# Patient Record
Sex: Female | Born: 1990 | Race: White | Hispanic: No | Marital: Single | Smoking: Never smoker
Health system: Southern US, Community
[De-identification: ages and names within clinical notes are randomized; demographics above are authoritative.]

## PROBLEM LIST (undated history)

## (undated) DIAGNOSIS — R569 Unspecified convulsions: Secondary | ICD-10-CM

## (undated) HISTORY — PX: KNEE SURGERY: SHX244

## (undated) HISTORY — PX: OTHER SURGICAL HISTORY: SHX169

---
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
## 2009-10-23 ENCOUNTER — Ambulatory Visit: Payer: Self-pay | Admitting: Internal Medicine

## 2011-08-12 ENCOUNTER — Observation Stay: Payer: Self-pay | Admitting: Obstetrics & Gynecology

## 2011-08-25 ENCOUNTER — Ambulatory Visit: Payer: Self-pay | Admitting: Obstetrics and Gynecology

## 2011-08-25 LAB — CBC WITH DIFFERENTIAL/PLATELET
Basophil #: 0 10*3/uL (ref 0.0–0.1)
Eosinophil #: 0.3 10*3/uL (ref 0.0–0.7)
Eosinophil %: 2.5 %
HCT: 42.2 % (ref 35.0–47.0)
Lymphocyte %: 20.5 %
MCH: 32.3 pg (ref 26.0–34.0)
MCHC: 33.7 g/dL (ref 32.0–36.0)
MCV: 96 fL (ref 80–100)
Monocyte %: 7.9 %
Neutrophil #: 7.9 10*3/uL — ABNORMAL HIGH (ref 1.4–6.5)
Neutrophil %: 68.8 %
Platelet: 266 10*3/uL (ref 150–440)
RDW: 12.9 % (ref 11.5–14.5)
WBC: 11.4 10*3/uL — ABNORMAL HIGH (ref 3.6–11.0)

## 2011-08-26 ENCOUNTER — Inpatient Hospital Stay: Payer: Self-pay | Admitting: Obstetrics and Gynecology

## 2011-08-26 LAB — BASIC METABOLIC PANEL
Anion Gap: 11 (ref 7–16)
Calcium, Total: 8.6 mg/dL (ref 8.5–10.1)
Co2: 24 mmol/L (ref 21–32)
Creatinine: 0.73 mg/dL (ref 0.60–1.30)
EGFR (African American): >60
EGFR (Non-African Amer.): >60
Glucose: 91 mg/dL (ref 65–99)
Potassium: 3.8 mmol/L (ref 3.5–5.1)
Sodium: 142 mmol/L (ref 136–145)

## 2014-03-18 ENCOUNTER — Emergency Department: Payer: Self-pay | Admitting: Student

## 2014-11-10 NOTE — Op Note (Signed)
PATIENT NAME:  Michelle Wallace, Michelle Wallace MR#:  [id number] DATE OF BIRTH:  09/12/1990  DATE OF PROCEDURE:  08/26/2011  PREOPERATIVE DIAGNOSIS: Term intrauterine pregnancy at 39 weeks 1 day, in breech presentation.   POSTOPERATIVE DIAGNOSIS: Term intrauterine pregnancy at 39 weeks 1 day, in breech presentation.   PROCEDURE PERFORMED: Primary low transverse C-section   SURGEON: Vena AustriaAndreas Lenice Koper, MD  ASSISTANT: Senaida LangeLashawn Weaver-Lee, MD  ANESTHESIA: Spinal epidural.  ESTIMATED BLOOD LOSS: 700 mL.  OPERATIVE FLUIDS: 1100 mL of crystalloid.   URINE OUTPUT: 400 mL of clear urine.   DRAINS OR TUBES: Foley to gravity drainage as well as On-Q system.  IMPLANTS: None.   INTRAOPERATIVE FINDINGS: Normal uterus, tubes, and ovaries. The fetus was noted to be in the footling breech position. Apgars 8 and 9. Weight 7 pounds, 11 ounces.   COMPLICATIONS: None.   PATIENT CONDITION FOLLOWING PROCEDURE: Stable.   PROCEDURE IN DETAIL: The risks, benefits, and alternatives of the procedure were discussed with the patient prior to proceeding to the Operating Room. The patient was placed under spinal anesthesia, prepped and draped in the usual sterile fashion, and positioned in the supine position. A Pfannenstiel skin incision was made 2 cm above the patient's pubic symphysis. This was carried down sharply to the level of the rectus fascia. The rectus fascia was incised sharply with the knife. The rectus fascia was then extended using Mayo scissors. The superior border of the rectus fascia was grasped with two Kocher clamps. The underlying rectus muscle was bluntly separated off the rectus fascia and the median raphe incised using Mayo scissors. The inferior border of the rectus fascia was then also dissected off the rectus muscles in a similar fashion. The rectus muscles were separated in the midline. The peritoneum was identified and entered bluntly. The peritoneal incision was extended using manual traction. A  bladder blade was placed. Next, a bladder flap was made and digitally developed using the operator's finger and the bladder blade was replaced. Hysterotomy incision was made. Upon entering the uterus bluntly, using the operator's finger, return of clear fluid was noted. The infant was noted to be in the footling breech position. The feet were grasped and delivered. The remainder of the body was delivered to the level of the axilla. The left arm was grasped, placed across the infant's chest, and then the infant was rotated 180 degrees. The right arm was delivered in a similar fashion. Mauriceau-Smellie-Viet maneuver was used to help delivery of the head with mild fundal pressure. The infant was suctioned, the cord was clamped and cut, and the infant was passed to the awaiting pediatricians. The placenta was delivered using manual extraction. The uterus was exteriorized and wiped clean of clot and debris. The hysterotomy incision was closed using two layers of 0 Vicryl, one running locked and one horizontal imbricating. Following closure of the hysterotomy, the abdomen was irrigated and wiped clean of clots and debris. The peritoneum was then closed using a running 2-0 Vicryl. The rectus fascia and the underlying rectus muscles were inspected and noted to be hemostatic. An On-Q system was placed at this time. The two trocars were placed approximately 2 to 3 cm above the superior border of the Pfannenstiel skin incision, in the midline. The trocars were then removed. The catheters were threaded through the introducers which were then removed. Following placement of the On-Q system, the rectus fascia was closed using a #1 loop PDS, in a running fashion. The skin was irrigated. Hemostasis was achieved using the  Bovie. A 2-0 chromic was used to close the subcutaneous dead space. Skin was closed using staples. The On-Q catheters were primed using 0.5% bupivacaine, 5 mL each, and the catheters were then dressed with  Steri-Strips and an OpSite. Sponge, needle, and instrument counts were correct x2. The patient tolerated the procedure well and was taken to the Recovery Room in stable condition. ____________________________ Florina Ou. Bonney Aid, MD ams:slb D: 09/06/2011 13:54:29 ET     T: 09/06/2011 15:40:56 ET       JOB#: [id number] Lorrene Reid MD ELECTRONICALLY SIGNED 09/07/2011 8:56

## 2014-11-26 NOTE — H&P (Signed)
L&D Evaluation:  History:   HPI 24 yo G1 at 9038w1d with fetus in breech presentation presenting for primary LTCS    Presents with SCheduled C-section    Patient's Medical History Obesity, seizures/convulsions    Patient's Surgical History none    Medications Pre Natal Vitamins    Allergies NKDA    Social History none    Family History Non-Contributory   ROS:   ROS All systems were reviewed.  HEENT, CNS, GI, GU, Respiratory, CV, Renal and Musculoskeletal systems were found to be normal.   Exam:   Vital Signs stable    Urine Protein not completed    General no apparent distress    Mental Status clear    Abdomen gravid, non-tender    Estimated Fetal Weight Average for gestational age    Fetal Position breech    Edema 1+  Cellulitis of the right lower extremity    Mebranes Intact    FHT normal rate with no decels    Ucx absent   Impression:   Impression 8038w1d wscheduled C-section for Breech   Plan:   Comments - Ancef 2g preop - On-Q pump - Proceed with primary LTCS   Electronic Signatures: Lorrene ReidStaebler, Cory Kitt M (MD)  (Signed 07-Feb-13 07:16)  Authored: L&D Evaluation   Last Updated: 07-Feb-13 07:16 by Lorrene ReidStaebler, Leonila Speranza M (MD)

## 2016-04-30 LAB — HM HIV SCREENING LAB: HM HIV Screening: NEGATIVE

## 2017-11-11 DIAGNOSIS — R569 Unspecified convulsions: Secondary | ICD-10-CM | POA: Insufficient documentation

## 2017-11-11 LAB — HM PAP SMEAR: HM Pap smear: NEGATIVE

## 2018-12-12 ENCOUNTER — Telehealth: Payer: Self-pay

## 2018-12-12 DIAGNOSIS — Z20822 Contact with and (suspected) exposure to covid-19: Secondary | ICD-10-CM

## 2018-12-12 NOTE — Telephone Encounter (Signed)
[facility] [place]  [facility] called requesting Pt.  For Covid testing.  Ot.  Voices understand for appt. Made.

## 2018-12-13 ENCOUNTER — Other Ambulatory Visit: Payer: Self-pay

## 2018-12-13 DIAGNOSIS — Z20822 Contact with and (suspected) exposure to covid-19: Secondary | ICD-10-CM

## 2018-12-14 LAB — NOVEL CORONAVIRUS, NAA: SARS-CoV-2, NAA: NOT DETECTED

## 2018-12-21 ENCOUNTER — Telehealth: Payer: Self-pay | Admitting: *Deleted

## 2018-12-21 NOTE — Telephone Encounter (Signed)
Pt calling for covid results; negative, verbalizes understanding. 

## 2019-02-26 ENCOUNTER — Ambulatory Visit
Admit: 2019-02-26 | Discharge: 2019-02-26 | Disposition: A | Discharge status: Home / Self Care | Payer: Medicaid Other | Attending: Family Medicine | Admitting: Family Medicine

## 2019-02-26 ENCOUNTER — Other Ambulatory Visit: Payer: Self-pay

## 2019-02-26 ENCOUNTER — Encounter: Payer: Self-pay | Admitting: Emergency Medicine

## 2019-02-26 ENCOUNTER — Ambulatory Visit
Admission: EM | Admit: 2019-02-26 | Discharge: 2019-02-26 | Disposition: A | Discharge status: Home / Self Care | Payer: Medicaid Other | Attending: Family Medicine | Admitting: Family Medicine

## 2019-02-26 DIAGNOSIS — R102 Pelvic and perineal pain: Secondary | ICD-10-CM

## 2019-02-26 DIAGNOSIS — N9489 Other specified conditions associated with female genital organs and menstrual cycle: Secondary | ICD-10-CM

## 2019-02-26 LAB — URINALYSIS, COMPLETE (UACMP) WITH MICROSCOPIC
Glucose, UA: NEGATIVE mg/dL
Nitrite: NEGATIVE
Specific Gravity, Urine: >1.03 — ABNORMAL HIGH (ref 1.005–1.030)
pH: 5.5 (ref 5.0–8.0)

## 2019-02-26 LAB — PREGNANCY, URINE: Preg Test, Ur: NEGATIVE

## 2019-02-26 NOTE — ED Provider Notes (Signed)
[facility]    CSN: [id number] Arrival date & time: 02/26/19  1005     History   Chief Complaint Chief Complaint  Patient presents with  . Pelvic Pain    right    HPI Michelle Wallace is a 28 y.o. female.   28 yo female with a c/o right sided pelvic pain for the past 1-2 weeks. Denies any dysuria, hematuria, fevers, chills, vomiting, vaginal discharge, injuries, diarrhea. States she has had some constipation. Also states she has a h/o a right  fallopian tube abscess about 1 year ago which required hospitalization and IV antibiotics. States she did not require surgery.    Pelvic Pain    History reviewed. No pertinent past medical history.  There are no active problems to display for this patient.   Past Surgical History:  Procedure Laterality Date  . CESAREAN SECTION    . KNEE SURGERY Left     OB History   No obstetric history on file.      Home Medications    Prior to Admission medications   Medication Sig Start Date End Date Taking? Authorizing Provider  levonorgestrel (MIRENA) 20 MCG/24HR IUD 1 each by Intrauterine route once.   Yes [provider]    Family History Family History  Problem Relation Age of Onset  . Healthy Mother   . Kidney Stones Father     Social History Social History   Tobacco Use  . Smoking status: Never Smoker  . Smokeless tobacco: Never Used  Substance Use Topics  . Alcohol use: Not Currently  . Drug use: Never     Allergies   Patient has no known allergies.   Review of Systems Review of Systems  Genitourinary: Positive for pelvic pain.     Physical Exam Triage Vital Signs ED Triage Vitals  Enc Vitals Group     BP 02/26/19 1037 116/74     Pulse Rate 02/26/19 1037 63     Resp 02/26/19 1037 14     Temp 02/26/19 1037 98.4 F (36.9 C)     Temp Source 02/26/19 1037 Oral     SpO2 02/26/19 1037 100 %     Weight 02/26/19 1033 280 lb (127 kg)     Height 02/26/19 1033 5\' 7"  (1.702 m)     Head Circumference --      Peak Flow --      Pain Score 02/26/19 1033 5     Pain Loc --      Pain Edu? --      Excl. in [place]? --    No data found.  Updated Vital Signs BP 116/74 (BP Location: Left Arm)   Pulse 63   Temp 98.4 F (36.9 C) (Oral)   Resp 14   Ht 5\' 7"  (1.702 m)   Wt 127 kg   LMP 02/19/2019 (Approximate)   SpO2 100%   BMI 43.85 kg/m   Visual Acuity Right Eye Distance:   Left Eye Distance:   Bilateral Distance:    Right Eye Near:   Left Eye Near:    Bilateral Near:     Physical Exam Vitals signs reviewed.  Constitutional:      General: She is not in acute distress.    Appearance: She is not toxic-appearing or diaphoretic.  Abdominal:     General: Bowel sounds are normal. There is no distension.     Palpations: Abdomen is soft. There is no mass.     Tenderness: There is  abdominal tenderness (right lower abdomen (pelvis area); not tender at McBurney's point). There is no right CVA tenderness, left CVA tenderness, guarding or rebound.     Hernia: No hernia is present.  Neurological:     Mental Status: She is alert.      UC Treatments / Results  Labs (all labs ordered are listed, but only abnormal results are displayed) Labs Reviewed  URINALYSIS, COMPLETE (UACMP) WITH MICROSCOPIC - Abnormal; Notable for the following components:      Result Value   APPearance HAZY (*)    Specific Gravity, Urine >1.030 (*)    Hgb urine dipstick LARGE (*)    Bilirubin Urine SMALL (*)    Ketones, ur TRACE (*)    Protein, ur TRACE (*)    Leukocytes,Ua TRACE (*)    Bacteria, UA FEW (*)    All other components within normal limits  URINE CULTURE  PREGNANCY, URINE    EKG   Radiology [place] Pelvic Complete With Transvaginal  Result Date: 02/26/2019 CLINICAL DATA:  Pelvic pain. EXAM: TRANSABDOMINAL AND TRANSVAGINAL ULTRASOUND OF PELVIS TECHNIQUE: Both transabdominal and transvaginal ultrasound examinations of the pelvis were performed. Transabdominal technique was  performed for global imaging of the pelvis including uterus, ovaries, adnexal regions, and pelvic cul-de-sac. It was necessary to proceed with endovaginal exam following the transabdominal exam to visualize the uterus and ovaries. COMPARISON:  01/14/2013. FINDINGS: Limited exam due to patient's body habitus. Uterus Measurements: 8.9 x 3.4 x 4.2 cm = volume: 65.2 mL. No fibroids or other mass visualized. Endometrium Thickness: 7.1 mm. IUD is noted position in the cervix area. Trace amount of fluid noted endometrial canal. Right ovary Measurements: Right ovary not visualized. Prominent serpiginous structure in the right adnexa most consistent with prominent fallopian tube. PID/tubo-ovarian abscess cannot be excluded. To exclude ectopic pregnancy, pregnancy test is suggested. Left ovary Measurements: Left ovary not visualized. Other findings Trace free pelvic fluid. IMPRESSION: 1. Limited exam due to patient's body habitus. Prominence of vision structure noted in the right adnexa most consistent prominent fallopian tube. PID/tubo-ovarian abscess cannot be excluded. To exclude ectopic pregnancy, pregnancy test is suggested. 2. IUD is noted in the cervix. Small amount of fluid noted in the endometrial cavity. 3.  Trace free pelvic fluid. Electronically Signed   By: Maisie Fushomas  Register   On: 02/26/2019 12:49    Procedures Procedures (including critical care time)  Medications Ordered in UC Medications - No data to display  Initial Impression / Assessment and Plan / UC Course  I have reviewed the triage vital signs and the nursing notes.  Pertinent labs & imaging results that were available during my care of the patient were reviewed by me and considered in my medical decision making (see chart for details).      Final Clinical Impressions(s) / UC Diagnoses   Final diagnoses:  Pelvic pain  Adnexal mass     Discharge Instructions     Recommend patient go to Emergency Department for further evaluation  and management    ED Prescriptions    None     1. Labs/US results and diagnosis reviewed with patient; recommend patient go to Emergency Department for further evaluation and management. Patient in stable condition. Will proceed by private vehicle.   Controlled Substance Prescriptions [place] Controlled Substance Registry consulted? Not Applicable   Payton Mccallumonty, Elizah Mierzwa, MD 02/26/19 [phone]

## 2019-02-26 NOTE — ED Triage Notes (Signed)
Patient c/o right sided pelvic pain that started about 1-2 weeks ago.  Patient reports having some constipation.  Patient also states that she was diagnosed with an abscess on her right fallopian tube last year.  Patient states that she has had her IUD in for 5 years. Patient denies vaginal discharge.  Patient denies any urinary symptoms.

## 2019-02-26 NOTE — Discharge Instructions (Signed)
Recommend patient go to Emergency Department for further evaluation and management °

## 2019-02-27 LAB — URINE CULTURE
Culture: <10000 — AB
Special Requests: NORMAL

## 2019-11-20 DIAGNOSIS — R569 Unspecified convulsions: Secondary | ICD-10-CM

## 2019-11-21 ENCOUNTER — Encounter: Payer: Self-pay | Admitting: Physician Assistant

## 2019-11-21 ENCOUNTER — Ambulatory Visit: Payer: Self-pay | Admitting: Physician Assistant

## 2019-11-21 ENCOUNTER — Other Ambulatory Visit: Payer: Self-pay

## 2019-11-21 DIAGNOSIS — Z113 Encounter for screening for infections with a predominantly sexual mode of transmission: Secondary | ICD-10-CM

## 2019-11-21 DIAGNOSIS — Z202 Contact with and (suspected) exposure to infections with a predominantly sexual mode of transmission: Secondary | ICD-10-CM

## 2019-11-21 LAB — WET PREP FOR TRICH, YEAST, CLUE
Trichomonas Exam: NEGATIVE
Yeast Exam: NEGATIVE

## 2019-11-21 MED ORDER — CEFTRIAXONE SODIUM 250 MG IJ SOLR
500.0000 mg | Freq: Once | INTRAMUSCULAR | Status: AC
  Administered 2019-11-21: 500 mg via INTRAMUSCULAR

## 2019-11-21 MED ORDER — AZITHROMYCIN 500 MG PO TABS
1000.0000 mg | ORAL_TABLET | Freq: Once | ORAL | Status: AC
  Administered 2019-11-21: 1000 mg via ORAL

## 2019-11-21 NOTE — Progress Notes (Signed)
Wet mount reviewed with provider and no treatment needed per Michelle Haber, PA verbal order as pt is not having any symptoms. Pt treated as a contact to Gonorrhea per Michelle Haber, PA orders and pt tolerated well. Provider orders completed.Lyman Speller, RN

## 2019-11-21 NOTE — Progress Notes (Signed)
Pt here for STD screening.Kasia Kyren Knick, RN 

## 2019-11-22 ENCOUNTER — Encounter: Payer: Self-pay | Admitting: Physician Assistant

## 2019-11-22 NOTE — Progress Notes (Signed)
[facility] Department STI clinic/screening visit  Subjective:  LAMONT GLASSCOCK is a 29 y.o. female being seen today for an STI screening visit. The patient reports they do not have symptoms.  Patient reports that they do not desire a pregnancy in the next year.   They reported they are not interested in discussing contraception today.  No LMP recorded (lmp unknown).   Patient has the following medical conditions:   Patient Active Problem List   Diagnosis Date Noted  . Seizures (HCC) 11/11/2017    Chief Complaint  Patient presents with  . SEXUALLY TRANSMITTED DISEASE    STD screening    HPI  Patient reports that she is not having any symptoms but is a contact to [facility] and would like a screening and treatment today.  Reports history of seizures but is not on any medication and it has been years since her last seizure.  Reports that in 2020 she had R salpingectomy due to tubo-ovarian abscess on that side twice.  Also, reports that she has both ovaries and her left tube still.  States that she had a Mirena but it was removed due to the fact that it had shifted and was no longer in the proper place.    See flowsheet for further details and programmatic requirements.    The following portions of the patient's history were reviewed and updated as appropriate: allergies, current medications, past medical history, past social history, past surgical history and problem list.  Objective:  There were no vitals filed for this visit.  Physical Exam Constitutional:      General: She is not in acute distress.    Appearance: Normal appearance. She is obese.  HENT:     Head: Normocephalic and atraumatic.     Comments: No nits, lice, or hair loss. No cervical, supraclavicular, or axillary adenopathy.     Mouth/Throat:     Mouth: Mucous membranes are moist.     Pharynx: Oropharynx is clear. No oropharyngeal exudate or posterior oropharyngeal erythema.  Eyes:   Conjunctiva/sclera: Conjunctivae normal.  Pulmonary:     Effort: Pulmonary effort is normal.  Abdominal:     Palpations: Abdomen is soft. There is no mass.     Tenderness: There is no abdominal tenderness. There is no guarding or rebound.  Genitourinary:    General: Normal vulva.     Rectum: Normal.     Comments: External genitalia/pubic area without nits, lice, edema,erythema, lesions and inguinal adenopathy. Vagina with normal mucosa and discharge. Cervix without visible lesions. Uterus firm, mobile, nt, no masses, no CMT, no adnexal tenderness or fullness. Musculoskeletal:     Cervical back: Neck supple. No tenderness.  Skin:    General: Skin is warm and dry.     Findings: No bruising, erythema, lesion or rash.  Neurological:     Mental Status: She is alert and oriented to person, place, and time.  Psychiatric:        Mood and Affect: Mood normal.        Behavior: Behavior normal.        Thought Content: Thought content normal.        Judgment: Judgment normal.      Assessment and Plan:  MARIELIS SAMARA is a 29 y.o. female presenting to the [facility] Department for STI screening  1. Screening for STD (sexually transmitted disease) Patient into clinic without symptoms. Rec condoms with all sex. Await test results.  Counseled that RN will call  if needs to RTC for further treatment once results are back.  - WET PREP FOR [place], YEAST, CLUE - Chlamydia/Gonorrhea [place] [facility] - HIV [place] [facility] - Syphilis Serology, [place] [facility]  2. Gonorrhea contact Treat as a contact to [facility] and to cover for possible Chlamydia infection with Ceftriaxone 500mg  IM and Azithromycin 1 g po DOT today. No sex for 7 days and until after partner completes treatment. - cefTRIAXone (ROCEPHIN) injection 500 mg - azithromycin (ZITHROMAX) tablet 1,000 mg     Return if symptoms worsen or fail to improve.  No future appointments.  Jerene Dilling, PA

## 2019-11-28 ENCOUNTER — Telehealth: Payer: Self-pay | Admitting: Family Medicine

## 2019-11-28 ENCOUNTER — Telehealth: Payer: Self-pay

## 2019-11-28 DIAGNOSIS — A549 Gonococcal infection, unspecified: Secondary | ICD-10-CM

## 2019-11-28 NOTE — Telephone Encounter (Signed)
Patient would like STI results. °

## 2019-11-28 NOTE — Telephone Encounter (Signed)
TC to patient. Verified ID via password/SS#. Informed of positive GC. Patient tx'd at visit on 11/18/19 and partner has been treated also. Instructed to f/u in 2-3 months for TOC.Richmond Campbell, RN

## 2019-11-28 NOTE — Telephone Encounter (Signed)
Phone call to pt. Pt counseled that we do not have all her test results from last weeks testing (pt specifically concerned about GC). Pt counseled that if something comes back positive, we would call her right away. TR appt scheduled for 12/12/19 per pt request.

## 2019-12-12 ENCOUNTER — Other Ambulatory Visit: Payer: Self-pay

## 2020-03-08 ENCOUNTER — Ambulatory Visit
Admission: EM | Admit: 2020-03-08 | Discharge: 2020-03-08 | Disposition: A | Discharge status: Home / Self Care | Payer: Medicaid Other | Attending: Family Medicine | Admitting: Family Medicine

## 2020-03-08 ENCOUNTER — Encounter: Payer: Self-pay | Admitting: Emergency Medicine

## 2020-03-08 ENCOUNTER — Other Ambulatory Visit: Payer: Self-pay

## 2020-03-08 DIAGNOSIS — L02412 Cutaneous abscess of left axilla: Secondary | ICD-10-CM

## 2020-03-08 MED ORDER — DOXYCYCLINE HYCLATE 100 MG PO CAPS: 100.0000 mg | ORAL_CAPSULE | Freq: Two times a day (BID) | ORAL | 0 refills | Status: DC

## 2020-03-08 MED ORDER — FLUCONAZOLE 150 MG PO TABS: 150.0000 mg | ORAL_TABLET | Freq: Once | ORAL | 0 refills | Status: AC

## 2020-03-08 NOTE — ED Triage Notes (Signed)
Patient c/o abscess in her left armpit.  Patient reports redness and tenderness at the site.

## 2020-03-08 NOTE — ED Provider Notes (Signed)
[facility]    CSN: [id number] Arrival date & time: 03/08/20  1244      History   Chief Complaint Chief Complaint  Patient presents with  . Abscess   HPI  29 year old female presents with a painful bump in her left axilla.  Patient states that she has noticed this over the past few days.  She states that the area is red and is tender to palpation.  She is unsure of the inciting event but has recently shaved.  Patient states that she only has pain when the area is palpated.  No fever.  No drainage.  No medications or interventions tried.  No relieving factors.  No other reported symptoms.  No other complaints.  Patient Active Problem List   Diagnosis Date Noted  . Seizures (HCC) 11/11/2017   Past Surgical History:  Procedure Laterality Date  . CESAREAN SECTION    . KNEE SURGERY Left   . right salpingectomy      OB History   No obstetric history on file.      Home Medications    Prior to Admission medications   Medication Sig Start Date End Date Taking? Authorizing Provider  doxycycline (VIBRAMYCIN) 100 MG capsule Take 1 capsule (100 mg total) by mouth 2 (two) times daily. 03/08/20   Tommie Sams, DO  fluconazole (DIFLUCAN) 150 MG tablet Take 1 tablet (150 mg total) by mouth once for 1 dose. Repeat dose in 72 hours. 03/08/20 03/08/20  Tommie Sams, DO    Family History Family History  Problem Relation Age of Onset  . Healthy Mother   . Kidney Stones Father     Social History Social History   Tobacco Use  . Smoking status: Never Smoker  . Smokeless tobacco: Never Used  Vaping Use  . Vaping Use: Never used  Substance Use Topics  . Alcohol use: Not Currently  . Drug use: Never     Allergies   Patient has no known allergies.   Review of Systems Review of Systems  Constitutional: Negative.   Skin:       Bump, left axilla.   Physical Exam Triage Vital Signs ED Triage Vitals  Enc Vitals Group     BP 03/08/20 1328 123/76     Pulse  Rate 03/08/20 1328 78     Resp 03/08/20 1328 16     Temp 03/08/20 1328 98.5 F (36.9 C)     Temp Source 03/08/20 1328 Oral     SpO2 03/08/20 1328 98 %     Weight 03/08/20 1323 280 lb (127 kg)     Height 03/08/20 1323 5\' 7"  (1.702 m)     Head Circumference --      Peak Flow --      Pain Score 03/08/20 1323 0     Pain Loc --      Pain Edu? --      Excl. in [place]? --    Updated Vital Signs BP 123/76 (BP Location: Left Arm)   Pulse 78   Temp 98.5 F (36.9 C) (Oral)   Resp 16   Ht 5\' 7"  (1.702 m)   Wt 127 kg   LMP 02/23/2020 (Approximate)   SpO2 98%   BMI 43.85 kg/m   Visual Acuity Right Eye Distance:   Left Eye Distance:   Bilateral Distance:    Right Eye Near:   Left Eye Near:    Bilateral Near:     Physical Exam Vitals and nursing  note reviewed.  Constitutional:      General: She is not in acute distress.    Appearance: Normal appearance. She is obese. She is not ill-appearing.  HENT:     Head: Normocephalic and atraumatic.  Eyes:     General:        Right eye: No discharge.        Left eye: No discharge.     Conjunctiva/sclera: Conjunctivae normal.  Pulmonary:     Effort: Pulmonary effort is normal. No respiratory distress.  Skin:    Comments: Left axilla with an area of fluctuance and induration.  The erythema spans 9 cm x 3 cm.  Neurological:     Mental Status: She is alert.  Psychiatric:        Mood and Affect: Mood normal.        Behavior: Behavior normal.     UC Treatments / Results  Labs (all labs ordered are listed, but only abnormal results are displayed) Labs Reviewed - No data to display  EKG   Radiology No results found.  Procedures Incision and Drainage  Date/Time: 03/08/2020 4:22 PM Performed by: Tommie Sams, DO Authorized by: Tommie Sams, DO   Consent:    Consent obtained:  Verbal   Consent given by:  Patient Location:    Type:  Abscess   Location:  Trunk   Trunk location: Left axilla. Pre-procedure details:    Skin  preparation:  Chloraprep Anesthesia (see MAR for exact dosages):    Anesthesia method:  Local infiltration   Local anesthetic:  Lidocaine 1% WITH epi Procedure type:    Complexity:  Simple Procedure details:    Incision types:  Stab incision   Scalpel blade:  11   Drainage:  Purulent and bloody   Drainage amount:  Moderate   Wound treatment:  Wound left open   Packing materials:  1/4 in gauze Post-procedure details:    Patient tolerance of procedure:  Tolerated well, no immediate complications   (including critical care time)  Medications Ordered in UC Medications - No data to display  Initial Impression / Assessment and Plan / UC Course  I have reviewed the triage vital signs and the nursing notes.  Pertinent labs & imaging results that were available during my care of the patient were reviewed by me and considered in my medical decision making (see chart for details).    29 year old female presents with an abscess to the left axilla.  Incision and drainage performed as above.  Placing on doxycycline.  Patient requested Diflucan for possible development of yeast vaginitis from antibiotic therapy.  Patient to remove packing in 48 hours.  Supportive care.  Final Clinical Impressions(s) / UC Diagnoses   Final diagnoses:  Abscess of left axilla     Discharge Instructions     Keep clean.  Remove packing in 48 hours.  Antibiotic as prescribed.  Take care  Dr. Adriana Simas    ED Prescriptions    Medication Sig Dispense Auth. Provider   doxycycline (VIBRAMYCIN) 100 MG capsule Take 1 capsule (100 mg total) by mouth 2 (two) times daily. 20 capsule Trease Bremner G, DO   fluconazole (DIFLUCAN) 150 MG tablet Take 1 tablet (150 mg total) by mouth once for 1 dose. Repeat dose in 72 hours. 2 tablet Tommie Sams, DO     PDMP not reviewed this encounter.   Tommie Sams, [place] 03/08/20 1623

## 2020-03-08 NOTE — Discharge Instructions (Signed)
Keep clean.  Remove packing in 48 hours.  Antibiotic as prescribed.  Take care  Dr. Adriana Simas

## 2020-08-13 ENCOUNTER — Other Ambulatory Visit: Payer: Self-pay

## 2020-08-13 ENCOUNTER — Ambulatory Visit
Admission: EM | Admit: 2020-08-13 | Discharge: 2020-08-13 | Disposition: A | Discharge status: Home / Self Care | Payer: No Typology Code available for payment source | Attending: Physician Assistant | Admitting: Physician Assistant

## 2020-08-13 ENCOUNTER — Encounter: Payer: Self-pay | Admitting: Emergency Medicine

## 2020-08-13 DIAGNOSIS — Z8616 Personal history of COVID-19: Secondary | ICD-10-CM | POA: Diagnosis not present

## 2020-08-13 DIAGNOSIS — J029 Acute pharyngitis, unspecified: Secondary | ICD-10-CM | POA: Diagnosis not present

## 2020-08-13 DIAGNOSIS — Z20822 Contact with and (suspected) exposure to covid-19: Secondary | ICD-10-CM | POA: Diagnosis not present

## 2020-08-13 DIAGNOSIS — R059 Cough, unspecified: Secondary | ICD-10-CM | POA: Diagnosis not present

## 2020-08-13 DIAGNOSIS — J069 Acute upper respiratory infection, unspecified: Secondary | ICD-10-CM | POA: Diagnosis not present

## 2020-08-13 LAB — SARS CORONAVIRUS 2 (TAT 6-24 HRS): SARS Coronavirus 2: NEGATIVE

## 2020-08-13 LAB — GROUP A STREP BY PCR: Group A Strep by PCR: NOT DETECTED

## 2020-08-13 NOTE — ED Provider Notes (Signed)
[facility]    CSN: [id number] Arrival date & time: 08/13/20  0815      History   Chief Complaint Chief Complaint  Patient presents with  . Sore Throat  . Cough    HPI Michelle Wallace is a 29 y.o. female presenting for 3 day history of sore throat, nasal congestion, and cough.  Patient states that the sore throat seems to gotten little better but her cough is gotten worse.  She denies any fever, fatigue, body aches, headaches, chest discomfort, shortness of breath, abdominal pain, nausea/vomiting or diarrhea.  Patient denies any sick contacts.  She states that she thinks she might have been exposed to COVID-19 through a coworker but is not sure.  Admits to personal history of COVID-19 last year.  Has not been vaccinated for COVID-19.  Patient has been taking Mucinex and using cough drops with some improvement in symptoms.  She has no other concerns today.  HPI  History reviewed. No pertinent past medical history.  Patient Active Problem List   Diagnosis Date Noted  . Seizures (HCC) 11/11/2017    Past Surgical History:  Procedure Laterality Date  . CESAREAN SECTION    . KNEE SURGERY Left   . right salpingectomy      OB History   No obstetric history on file.      Home Medications    Prior to Admission medications   Medication Sig Start Date End Date Taking? Authorizing Provider  doxycycline (VIBRAMYCIN) 100 MG capsule Take 1 capsule (100 mg total) by mouth 2 (two) times daily. 03/08/20   Tommie Sams, DO    Family History Family History  Problem Relation Age of Onset  . Healthy Mother   . Kidney Stones Father     Social History Social History   Tobacco Use  . Smoking status: Never Smoker  . Smokeless tobacco: Never Used  Vaping Use  . Vaping Use: Never used  Substance Use Topics  . Alcohol use: Not Currently  . Drug use: Never     Allergies   Patient has no known allergies.   Review of Systems Review of Systems   Constitutional: Negative for chills, diaphoresis, fatigue and fever.  HENT: Positive for congestion and sore throat. Negative for ear pain, rhinorrhea, sinus pressure and sinus pain.   Respiratory: Positive for cough. Negative for shortness of breath.   Gastrointestinal: Negative for abdominal pain, nausea and vomiting.  Musculoskeletal: Negative for arthralgias and myalgias.  Skin: Negative for rash.  Neurological: Negative for weakness and headaches.  Hematological: Negative for adenopathy.     Physical Exam Triage Vital Signs ED Triage Vitals [08/13/20 0909]  Enc Vitals Group     BP      Pulse      Resp      Temp      Temp src      SpO2      Weight      Height 5\' 8"  (1.727 m)     Head Circumference      Peak Flow      Pain Score 4     Pain Loc      Pain Edu?      Excl. in GC?    No data found.  Updated Vital Signs BP 129/66 (BP Location: Right Arm)   Pulse 66   Temp 98.6 F (37 C) (Oral)   Resp 18   Ht 5\' 8"  (1.727 m)   LMP 08/06/2020   SpO2  100%   BMI 42.57 kg/m       Physical Exam Vitals and nursing note reviewed.  Constitutional:      General: She is not in acute distress.    Appearance: Normal appearance. She is not ill-appearing or toxic-appearing.  HENT:     Head: Normocephalic and atraumatic.     Nose: Congestion and rhinorrhea present.     Mouth/Throat:     Mouth: Mucous membranes are moist.     Pharynx: Oropharynx is clear. Posterior oropharyngeal erythema present.     Tonsils: 2+ on the right. 2+ on the left.  Eyes:     General: No scleral icterus.       Right eye: No discharge.        Left eye: No discharge.     Conjunctiva/sclera: Conjunctivae normal.  Cardiovascular:     Rate and Rhythm: Normal rate and regular rhythm.     Heart sounds: Normal heart sounds.  Pulmonary:     Effort: Pulmonary effort is normal. No respiratory distress.     Breath sounds: Normal breath sounds.  Musculoskeletal:     Cervical back: Neck supple.   Skin:    General: Skin is dry.  Neurological:     General: No focal deficit present.     Mental Status: She is alert. Mental status is at baseline.     Motor: No weakness.     Gait: Gait normal.  Psychiatric:        Mood and Affect: Mood normal.        Behavior: Behavior normal.        Thought Content: Thought content normal.      UC Treatments / Results  Labs (all labs ordered are listed, but only abnormal results are displayed) Labs Reviewed  SARS CORONAVIRUS 2 (TAT 6-24 HRS)  GROUP A STREP BY PCR    EKG   Radiology No results found.  Procedures Procedures (including critical care time)  Medications Ordered in UC Medications - No data to display  Initial Impression / Assessment and Plan / UC Course  I have reviewed the triage vital signs and the nursing notes.  Pertinent labs & imaging results that were available during my care of the patient were reviewed by me and considered in my medical decision making (see chart for details).   30 year old female with 3-day history of sore throat, cough and congestion.  Possible COVID-19 exposure.  She is not vaccinated.  All vital signs are normal and stable in the clinic.  Exam significant for 2+ tonsillar enlargement with erythema and nasal congestion/rhinorrhea.  Chest clear to auscultation heart regular rate and rhythm.  Negative rapid strep test. Suspect viral illness, Send out COVID-19 testing obtained.  Current CDC guidelines, isolation protocol and ED precautions reviewed if positive.  Advise supportive care with increasing rest and fluids.  Vies to continue Mucinex and throat lozenges.  Also advised she continue Chloraseptic spray and nasal saline.   Final Clinical Impressions(s) / UC Diagnoses   Final diagnoses:  Upper respiratory tract infection, unspecified type  Sore throat  Cough     Discharge Instructions     I will call you before noon today to let you know the results of the strep test if we are able  to run the rapid 1.  If we cannot then we will send the strep test out for culture which will take about 2 days to get the result back.  If test is positive you will need  antibiotics and I can send that when I get your results.  It is possible this could be COVID-19 or just a common cold.  Treatment is supportive at this time with continuing the measures you have been.  You have received COVID testing today either for positive exposure, concerning symptoms that could be related to COVID infection, screening purposes, or re-testing after confirmed positive.  Your test obtained today checks for active viral infection in the last 1-2 weeks. If your test is negative now, you can still test positive later. So, if you do develop symptoms you should either get re-tested and/or isolate x 5 days and then strict mask use x 5 days (unvaccinated) or mask use x 10 days (vaccinated). Please follow CDC guidelines.  While Rapid antigen tests come back in 15-20 minutes, send out PCR/molecular test results typically come back within 1-3 days. In the mean time, if you are symptomatic, assume this could be a positive test and treat/monitor yourself as if you do have COVID.   We will call with test results if positive. Please download the MyChart app and set up a profile to access test results.   If symptomatic, go home and rest. Push fluids. Take Tylenol as needed for discomfort. Gargle warm salt water. Throat lozenges. Take Mucinex DM or Robitussin for cough. Humidifier in bedroom to ease coughing. Warm showers. Also review the COVID handout for more information.  COVID-19 INFECTION: The incubation period of COVID-19 is approximately 14 days after exposure, with most symptoms developing in roughly 4-5 days. Symptoms may range in severity from mild to critically severe. Roughly 80% of those infected will have mild symptoms. People of any age may become infected with COVID-19 and have the ability to transmit the virus. The  most common symptoms include: fever, fatigue, cough, body aches, headaches, sore throat, nasal congestion, shortness of breath, nausea, vomiting, diarrhea, changes in smell and/or taste.    COURSE OF ILLNESS Some patients may begin with mild disease which can progress quickly into critical symptoms. If your symptoms are worsening please call ahead to the Emergency Department and proceed there for further treatment. Recovery time appears to be roughly 1-2 weeks for mild symptoms and 3-6 weeks for severe disease.   GO IMMEDIATELY TO ER FOR FEVER YOU ARE UNABLE TO GET DOWN WITH TYLENOL, BREATHING PROBLEMS, CHEST PAIN, FATIGUE, LETHARGY, INABILITY TO EAT OR DRINK, ETC  QUARANTINE AND ISOLATION: To help decrease the spread of COVID-19 please remain isolated if you have COVID infection or are highly suspected to have COVID infection. This means -stay home and isolate to one room in the home if you live with others. Do not share a bed or bathroom with others while ill, sanitize and wipe down all countertops and keep common areas clean and disinfected. Stay home for 5 days. If you have no symptoms or your symptoms are resolving after 5 days, you can leave your house. Continue to wear a mask around others for 5 additional days. If you have been in close contact (within 6 feet) of someone diagnosed with COVID 19, you are advised to quarantine in your home for 14 days as symptoms can develop anywhere from 2-14 days after exposure to the virus. If you develop symptoms, you  must isolate.  Most current guidelines for COVID after exposure -unvaccinated: isolate 5 days and strict mask use x 5 days. Test on day 5 is possible -vaccinated: wear mask x 10 days if symptoms do not develop -You do not necessarily  need to be tested for COVID if you have + exposure and  develop symptoms. Just isolate at home x10 days from symptom onset During this global pandemic, CDC advises to practice social distancing, try to stay at least  46ft away from others at all times. Wear a face covering. Wash and sanitize your hands regularly and avoid going anywhere that is not necessary.  KEEP IN MIND THAT THE COVID TEST IS NOT 100% ACCURATE AND YOU SHOULD STILL DO EVERYTHING TO PREVENT POTENTIAL SPREAD OF VIRUS TO OTHERS (WEAR MASK, WEAR GLOVES, WASH HANDS AND SANITIZE REGULARLY). IF INITIAL TEST IS NEGATIVE, THIS MAY NOT MEAN YOU ARE DEFINITELY NEGATIVE. MOST ACCURATE TESTING IS DONE 5-7 DAYS AFTER EXPOSURE.   It is not advised by CDC to get re-tested after receiving a positive COVID test since you can still test positive for weeks to months after you have already cleared the virus.   *If you have not been vaccinated for COVID, I strongly suggest you consider getting vaccinated as long as there are no contraindications.      ED Prescriptions    None     PDMP not reviewed this encounter.   Shirlee Latch, PA-C 08/13/20 1052

## 2020-08-13 NOTE — ED Triage Notes (Signed)
Patient c/o sore throat that started 3 days ago. Patient denies fever. She does report mild nasal congestion and cough.

## 2020-08-13 NOTE — Discharge Instructions (Signed)
I will call you before noon today to let you know the results of the strep test if we are able to run the rapid 1.  If we cannot then we will send the strep test out for culture which will take about 2 days to get the result back.  If test is positive you will need antibiotics and I can send that when I get your results.  It is possible this could be COVID-19 or just a common cold.  Treatment is supportive at this time with continuing the measures you have been.  You have received COVID testing today either for positive exposure, concerning symptoms that could be related to COVID infection, screening purposes, or re-testing after confirmed positive.  Your test obtained today checks for active viral infection in the last 1-2 weeks. If your test is negative now, you can still test positive later. So, if you do develop symptoms you should either get re-tested and/or isolate x 5 days and then strict mask use x 5 days (unvaccinated) or mask use x 10 days (vaccinated). Please follow CDC guidelines.  While Rapid antigen tests come back in 15-20 minutes, send out PCR/molecular test results typically come back within 1-3 days. In the mean time, if you are symptomatic, assume this could be a positive test and treat/monitor yourself as if you do have COVID.   We will call with test results if positive. Please download the MyChart app and set up a profile to access test results.   If symptomatic, go home and rest. Push fluids. Take Tylenol as needed for discomfort. Gargle warm salt water. Throat lozenges. Take Mucinex DM or Robitussin for cough. Humidifier in bedroom to ease coughing. Warm showers. Also review the COVID handout for more information.  COVID-19 INFECTION: The incubation period of COVID-19 is approximately 14 days after exposure, with most symptoms developing in roughly 4-5 days. Symptoms may range in severity from mild to critically severe. Roughly 80% of those infected will have mild symptoms. People  of any age may become infected with COVID-19 and have the ability to transmit the virus. The most common symptoms include: fever, fatigue, cough, body aches, headaches, sore throat, nasal congestion, shortness of breath, nausea, vomiting, diarrhea, changes in smell and/or taste.    COURSE OF ILLNESS Some patients may begin with mild disease which can progress quickly into critical symptoms. If your symptoms are worsening please call ahead to the Emergency Department and proceed there for further treatment. Recovery time appears to be roughly 1-2 weeks for mild symptoms and 3-6 weeks for severe disease.   GO IMMEDIATELY TO ER FOR FEVER YOU ARE UNABLE TO GET DOWN WITH TYLENOL, BREATHING PROBLEMS, CHEST PAIN, FATIGUE, LETHARGY, INABILITY TO EAT OR DRINK, ETC  QUARANTINE AND ISOLATION: To help decrease the spread of COVID-19 please remain isolated if you have COVID infection or are highly suspected to have COVID infection. This means -stay home and isolate to one room in the home if you live with others. Do not share a bed or bathroom with others while ill, sanitize and wipe down all countertops and keep common areas clean and disinfected. Stay home for 5 days. If you have no symptoms or your symptoms are resolving after 5 days, you can leave your house. Continue to wear a mask around others for 5 additional days. If you have been in close contact (within 6 feet) of someone diagnosed with COVID 19, you are advised to quarantine in your home for 14 days as symptoms  can develop anywhere from 2-14 days after exposure to the virus. If you develop symptoms, you  must isolate.  Most current guidelines for COVID after exposure -unvaccinated: isolate 5 days and strict mask use x 5 days. Test on day 5 is possible -vaccinated: wear mask x 10 days if symptoms do not develop -You do not necessarily need to be tested for COVID if you have + exposure and  develop symptoms. Just isolate at home x10 days from symptom  onset During this global pandemic, CDC advises to practice social distancing, try to stay at least 63ft away from others at all times. Wear a face covering. Wash and sanitize your hands regularly and avoid going anywhere that is not necessary.  KEEP IN MIND THAT THE COVID TEST IS NOT 100% ACCURATE AND YOU SHOULD STILL DO EVERYTHING TO PREVENT POTENTIAL SPREAD OF VIRUS TO OTHERS (WEAR MASK, WEAR GLOVES, WASH HANDS AND SANITIZE REGULARLY). IF INITIAL TEST IS NEGATIVE, THIS MAY NOT MEAN YOU ARE DEFINITELY NEGATIVE. MOST ACCURATE TESTING IS DONE 5-7 DAYS AFTER EXPOSURE.   It is not advised by CDC to get re-tested after receiving a positive COVID test since you can still test positive for weeks to months after you have already cleared the virus.   *If you have not been vaccinated for COVID, I strongly suggest you consider getting vaccinated as long as there are no contraindications.

## 2021-03-25 ENCOUNTER — Ambulatory Visit: Payer: Self-pay | Admitting: Family Medicine

## 2021-03-25 ENCOUNTER — Encounter: Payer: Self-pay | Admitting: Family Medicine

## 2021-03-25 ENCOUNTER — Other Ambulatory Visit: Payer: Self-pay

## 2021-03-25 VITALS — Wt 301.2 lb

## 2021-03-25 DIAGNOSIS — Z202 Contact with and (suspected) exposure to infections with a predominantly sexual mode of transmission: Secondary | ICD-10-CM

## 2021-03-25 DIAGNOSIS — Z113 Encounter for screening for infections with a predominantly sexual mode of transmission: Secondary | ICD-10-CM

## 2021-03-25 LAB — WET PREP FOR TRICH, YEAST, CLUE
Trichomonas Exam: NEGATIVE
Yeast Exam: NEGATIVE

## 2021-03-25 MED ORDER — DOXYCYCLINE HYCLATE 100 MG PO TABS: 100.0000 mg | ORAL_TABLET | Freq: Two times a day (BID) | ORAL | 0 refills | Status: AC

## 2021-03-25 MED ORDER — CEFTRIAXONE SODIUM 1 G IJ SOLR
1.0000 g | Freq: Once | INTRAMUSCULAR | Status: AC
  Administered 2021-03-25: 1 g via INTRAMUSCULAR

## 2021-03-25 NOTE — Progress Notes (Signed)
Pt here for STD screening and a contact to Gonorrhea.  Wet mount results reviewed.  Rocephin given IM without any complications.  Medication dispensed per Provider orders.  Pt declined condoms.

## 2021-03-25 NOTE — Progress Notes (Signed)
[facility] Department STI clinic/screening visit  Subjective:  Michelle Wallace is a 30 y.o. female being seen today for an STI screening visit. The patient reports they do have symptoms.  Patient reports that they do not desire a pregnancy in the next year.   They reported they are not interested in discussing contraception today.  Patient's last menstrual period was 03/09/2021 (within days).   Patient has the following medical conditions:   Patient Active Problem List   Diagnosis Date Noted   Seizures (HCC) 11/11/2017    Chief Complaint  Patient presents with   SEXUALLY TRANSMITTED DISEASE    Screening.  Contact to Gonorrhea    HPI  Patient reports here for screening and contact to gonorrhea   Last HIV test per patient/review of record was 11/21/2019 Patient reports last pap was 11/11/2017.   See flowsheet for further details and programmatic requirements.    The following portions of the patient's history were reviewed and updated as appropriate: allergies, current medications, past medical history, past social history, past surgical history and problem list.  Objective:   Vitals:   03/25/21 1202  Weight: (!) 301 lb 3.2 oz (136.6 kg)    Physical Exam Vitals and nursing note reviewed.  Constitutional:      Appearance: Normal appearance. She is obese.  HENT:     Head: Normocephalic and atraumatic.     Mouth/Throat:     Mouth: Mucous membranes are moist.     Pharynx: Oropharynx is clear. No oropharyngeal exudate or posterior oropharyngeal erythema.  Pulmonary:     Effort: Pulmonary effort is normal.  Abdominal:     General: Abdomen is flat.     Palpations: There is no mass.     Tenderness: There is no abdominal tenderness. There is no rebound.  Genitourinary:    General: Normal vulva.     Exam position: Lithotomy position.     Pubic Area: No rash or pubic lice.      Labia:        Right: No rash or lesion.        Left: No rash or lesion.       Vagina: Normal. No vaginal discharge, erythema, bleeding or lesions.     Cervix: No cervical motion tenderness, discharge, friability, lesion or erythema.     Uterus: Normal.      Adnexa: Right adnexa normal and left adnexa normal.     Rectum: Normal.     Comments: External genitalia without, lice, nits, erythema, edema , lesions or inguinal adenopathy. Vagina with normal mucosa and white discharge and pH < 4.  Cervix without visual lesions, uterus firm, mobile, non-tender, no masses, CMT adnexal fullness or tenderness.   Musculoskeletal:     Cervical back: Normal range of motion and neck supple.  Lymphadenopathy:     Head:     Right side of head: No preauricular or posterior auricular adenopathy.     Left side of head: No preauricular or posterior auricular adenopathy.     Cervical: No cervical adenopathy.     Upper Body:     Right upper body: No supraclavicular or axillary adenopathy.     Left upper body: No supraclavicular or axillary adenopathy.     Lower Body: No right inguinal adenopathy. No left inguinal adenopathy.  Skin:    General: Skin is warm and dry.     Findings: No rash.  Neurological:     Mental Status: She is alert and oriented to  person, place, and time.     Assessment and Plan:  Michelle Wallace is a 30 y.o. female presenting to the [facility] Department for STI screening  1. Screening examination for venereal disease  - WET PREP FOR TRICH, YEAST, CLUE - Chlamydia/Gonorrhea [facility] - HIV [place] [facility] - Syphilis Serology, [facility] - Chlamydia/Gonorrhea [facility]  Patient accepted all screenings including wet prep, oral, vaginal CT/GC and bloodwork for HIV/RPR.  Patient meets criteria for HepB screening? No. Ordered? No - dos not meet criteria  Patient meets criteria for HepC screening? No. Ordered? No - does not meet criteria   Wet prep results _+ amine, + clue    Treatment needed as contact for gonorrhea Discussed time line  for [facility] results and that patient will be called with positive results and encouraged patient to call if she had not heard in 2 weeks.  Counseled to return or seek care for continued or worsening symptoms Recommended condom use with all sex  Patient is currently using  no BCM   to prevent pregnancy.   2. Exposure to gonorrhea PT treated as contact to gonorrhea  - cefTRIAXone (ROCEPHIN) injection 1 g   Return for as needed.  No future appointments.  Wendi Snipes, FNP

## 2021-03-25 NOTE — Progress Notes (Signed)
Rocephin IM, 500mg  on R deltoid, 500mg  on L deltoid.

## 2021-04-01 ENCOUNTER — Telehealth: Payer: Self-pay

## 2021-04-01 NOTE — Telephone Encounter (Signed)
Attempted to contact pt regarding positive gonorrhea test results. No answer/ left message to return call. Pt called back results given. Pt was treated during OV on 9/7. Per pt finished last dose today. Also advised of HIV results. Syphilis is still pending.

## 2021-07-20 ENCOUNTER — Emergency Department: Payer: No Typology Code available for payment source

## 2021-07-20 ENCOUNTER — Other Ambulatory Visit: Payer: Self-pay

## 2021-07-20 DIAGNOSIS — J189 Pneumonia, unspecified organism: Secondary | ICD-10-CM | POA: Diagnosis present

## 2021-07-20 DIAGNOSIS — R0602 Shortness of breath: Secondary | ICD-10-CM | POA: Diagnosis not present

## 2021-07-20 DIAGNOSIS — A419 Sepsis, unspecified organism: Principal | ICD-10-CM | POA: Diagnosis present

## 2021-07-20 DIAGNOSIS — Z6841 Body Mass Index (BMI) 40.0 and over, adult: Secondary | ICD-10-CM

## 2021-07-20 DIAGNOSIS — J209 Acute bronchitis, unspecified: Secondary | ICD-10-CM | POA: Diagnosis present

## 2021-07-20 DIAGNOSIS — Z9079 Acquired absence of other genital organ(s): Secondary | ICD-10-CM

## 2021-07-20 DIAGNOSIS — G40909 Epilepsy, unspecified, not intractable, without status epilepticus: Secondary | ICD-10-CM | POA: Diagnosis present

## 2021-07-20 DIAGNOSIS — Z20822 Contact with and (suspected) exposure to covid-19: Secondary | ICD-10-CM | POA: Diagnosis present

## 2021-07-20 DIAGNOSIS — J9601 Acute respiratory failure with hypoxia: Secondary | ICD-10-CM | POA: Diagnosis present

## 2021-07-20 LAB — CBC WITH DIFFERENTIAL/PLATELET
Abs Immature Granulocytes: 0.03 10*3/uL (ref 0.00–0.07)
Basophils Absolute: 0.1 10*3/uL (ref 0.0–0.1)
Basophils Relative: 0 %
Eosinophils Absolute: 1.3 10*3/uL — ABNORMAL HIGH (ref 0.0–0.5)
Eosinophils Relative: 9 %
HCT: 42.5 % (ref 36.0–46.0)
Hemoglobin: 14 g/dL (ref 12.0–15.0)
Immature Granulocytes: 0 %
Lymphocytes Relative: 26 %
Lymphs Abs: 3.7 10*3/uL (ref 0.7–4.0)
MCH: 30.6 pg (ref 26.0–34.0)
MCHC: 32.9 g/dL (ref 30.0–36.0)
MCV: 93 fL (ref 80.0–100.0)
Monocytes Absolute: 1 10*3/uL (ref 0.1–1.0)
Monocytes Relative: 7 %
Neutro Abs: 8.1 10*3/uL — ABNORMAL HIGH (ref 1.7–7.7)
Neutrophils Relative %: 58 %
Platelets: 375 10*3/uL (ref 150–400)
RBC: 4.57 MIL/uL (ref 3.87–5.11)
RDW: 13.3 % (ref 11.5–15.5)
WBC: 14.1 10*3/uL — ABNORMAL HIGH (ref 4.0–10.5)
nRBC: 0 % (ref 0.0–0.2)

## 2021-07-20 LAB — COMPREHENSIVE METABOLIC PANEL
ALT: 40 U/L (ref 0–44)
AST: 33 U/L (ref 15–41)
Albumin: 3.8 g/dL (ref 3.5–5.0)
Alkaline Phosphatase: 88 U/L (ref 38–126)
Anion gap: 6 (ref 5–15)
BUN: 13 mg/dL (ref 6–20)
CO2: 28 mmol/L (ref 22–32)
Calcium: 9.1 mg/dL (ref 8.9–10.3)
Chloride: 107 mmol/L (ref 98–111)
Creatinine, Ser: 0.73 mg/dL (ref 0.44–1.00)
GFR, Estimated: >60 mL/min (ref >60)
Glucose, Bld: 99 mg/dL (ref 70–99)
Potassium: 4 mmol/L (ref 3.5–5.1)
Sodium: 141 mmol/L (ref 135–145)
Total Bilirubin: 0.6 mg/dL (ref 0.3–1.2)
Total Protein: 7.7 g/dL (ref 6.5–8.1)

## 2021-07-20 LAB — TROPONIN I (HIGH SENSITIVITY): Troponin I (High Sensitivity): <2 ng/L (ref <18)

## 2021-07-20 LAB — RESP PANEL BY RT-PCR (FLU A&B, COVID) ARPGX2
Influenza A by PCR: NEGATIVE
Influenza B by PCR: NEGATIVE
SARS Coronavirus 2 by RT PCR: NEGATIVE

## 2021-07-20 MED ORDER — IPRATROPIUM-ALBUTEROL 0.5-2.5 (3) MG/3ML IN SOLN
3.0000 mL | Freq: Once | RESPIRATORY_TRACT | Status: AC
Start: 2021-07-20 — End: 2021-07-20
  Administered 2021-07-20: 3 mL via RESPIRATORY_TRACT
  Filled 2021-07-20: qty 3

## 2021-07-20 MED ORDER — ALBUTEROL SULFATE (2.5 MG/3ML) 0.083% IN NEBU
2.5000 mg | INHALATION_SOLUTION | Freq: Once | RESPIRATORY_TRACT | Status: DC
  Filled 2021-07-20: qty 3

## 2021-07-20 NOTE — ED Triage Notes (Signed)
Pt presents to ER c/o chest heaviness, and sob for several days.  Pt states symptoms have been going on around a week at this time.  Pt denies hx of asthma or COPD.  Pt noted to be hypoxic at 86% on RA in triage after walking in with sats improving to 94% after resting.  Pt A&O x4.

## 2021-07-20 NOTE — ED Provider Notes (Signed)
Emergency Medicine Provider Triage Evaluation Note  Michelle Wallace , a 31 y.o. female  was evaluated in triage.  Pt complains of shortness of breath, chest tightness.  Patient had URI symptoms for a week, thought she was improving and then worsened again.  Patient arrived hypoxic at 85% with tachypnea.  No reported fevers.  Has been exposed to COVID.Marland Kitchen  Review of Systems  Positive: Chest pain, fevers, GI symptoms Negative: Nasal congestion, cough, chest tightness  Physical Exam  BP (!) 147/91 (BP Location: Left Arm)    Pulse 77    Temp 99.2 F (37.3 C) (Oral)    Resp (!) 22    SpO2 94%  Gen:   Awake, no distress   Resp:  Slight increased rate and effort.  Wheezing bilaterally MSK:   Moves extremities without difficulty  Other:    Medical Decision Making  Medically screening exam initiated at 8:06 PM.  Appropriate orders placed.  AALYSSA Wallace was informed that the remainder of the evaluation will be completed by another provider, this initial triage assessment does not replace that evaluation, and the importance of remaining in the ED until their evaluation is complete.  Patient arrives with URI symptoms times a week, thought she was improving and then worsened.  Patient will have labs, chest x-ray, COVID/flu swab.  Given the wheezing patient will be started on albuterol nebulizer here in triage.   Michelle Wallace 07/20/21 2010    Michelle Semen, MD 07/20/21 2234

## 2021-07-21 ENCOUNTER — Emergency Department: Payer: No Typology Code available for payment source

## 2021-07-21 ENCOUNTER — Inpatient Hospital Stay
Admission: EM | Admit: 2021-07-21 | Discharge: 2021-07-24 | DRG: 871 | Disposition: A | Discharge status: Home / Self Care | Payer: No Typology Code available for payment source | Attending: Internal Medicine | Admitting: Internal Medicine

## 2021-07-21 ENCOUNTER — Encounter: Payer: Self-pay | Admitting: Radiology

## 2021-07-21 DIAGNOSIS — R0602 Shortness of breath: Secondary | ICD-10-CM

## 2021-07-21 DIAGNOSIS — Z6841 Body Mass Index (BMI) 40.0 and over, adult: Secondary | ICD-10-CM | POA: Diagnosis not present

## 2021-07-21 DIAGNOSIS — R0902 Hypoxemia: Secondary | ICD-10-CM

## 2021-07-21 DIAGNOSIS — J209 Acute bronchitis, unspecified: Secondary | ICD-10-CM | POA: Diagnosis present

## 2021-07-21 DIAGNOSIS — J988 Other specified respiratory disorders: Secondary | ICD-10-CM

## 2021-07-21 DIAGNOSIS — J189 Pneumonia, unspecified organism: Secondary | ICD-10-CM

## 2021-07-21 DIAGNOSIS — G40909 Epilepsy, unspecified, not intractable, without status epilepticus: Secondary | ICD-10-CM | POA: Diagnosis present

## 2021-07-21 DIAGNOSIS — J9601 Acute respiratory failure with hypoxia: Secondary | ICD-10-CM | POA: Diagnosis present

## 2021-07-21 DIAGNOSIS — Z9079 Acquired absence of other genital organ(s): Secondary | ICD-10-CM | POA: Diagnosis not present

## 2021-07-21 DIAGNOSIS — A419 Sepsis, unspecified organism: Principal | ICD-10-CM

## 2021-07-21 DIAGNOSIS — Z20822 Contact with and (suspected) exposure to covid-19: Secondary | ICD-10-CM | POA: Diagnosis present

## 2021-07-21 HISTORY — DX: Unspecified convulsions: R56.9

## 2021-07-21 LAB — RESPIRATORY PANEL BY PCR

## 2021-07-21 LAB — LACTIC ACID, PLASMA: Lactic Acid, Venous: 1.7 mmol/L (ref 0.5–1.9)

## 2021-07-21 LAB — PROCALCITONIN: Procalcitonin: <0.1 ng/mL

## 2021-07-21 LAB — TROPONIN I (HIGH SENSITIVITY): Troponin I (High Sensitivity): 3 ng/L (ref <18)

## 2021-07-21 LAB — HIV ANTIBODY (ROUTINE TESTING W REFLEX): HIV Screen 4th Generation wRfx: NONREACTIVE

## 2021-07-21 MED ORDER — ENOXAPARIN SODIUM 40 MG/0.4ML IJ SOSY: 40.0000 mg | PREFILLED_SYRINGE | INTRAMUSCULAR | Status: DC

## 2021-07-21 MED ORDER — ALBUTEROL SULFATE (2.5 MG/3ML) 0.083% IN NEBU: 2.5000 mg | INHALATION_SOLUTION | RESPIRATORY_TRACT | Status: DC | PRN

## 2021-07-21 MED ORDER — IOHEXOL 350 MG/ML SOLN
100.0000 mL | Freq: Once | INTRAVENOUS | Status: AC | PRN
  Administered 2021-07-21: 100 mL via INTRAVENOUS
  Filled 2021-07-21: qty 100

## 2021-07-21 MED ORDER — ENOXAPARIN SODIUM 80 MG/0.8ML IJ SOSY
0.5000 mg/kg | PREFILLED_SYRINGE | INTRAMUSCULAR | Status: DC
  Administered 2021-07-21 – 2021-07-22 (×2): 67.5 mg via SUBCUTANEOUS
  Administered 2021-07-23: 70 mg via SUBCUTANEOUS
  Filled 2021-07-21 (×4): qty 0.68

## 2021-07-21 MED ORDER — METHYLPREDNISOLONE SODIUM SUCC 125 MG IJ SOLR
125.0000 mg | Freq: Once | INTRAMUSCULAR | Status: AC
  Administered 2021-07-21: 125 mg via INTRAVENOUS
  Filled 2021-07-21: qty 2

## 2021-07-21 MED ORDER — ONDANSETRON HCL 4 MG/2ML IJ SOLN: 4.0000 mg | Freq: Three times a day (TID) | INTRAMUSCULAR | Status: DC | PRN

## 2021-07-21 MED ORDER — SODIUM CHLORIDE 0.9 % IV BOLUS
1000.0000 mL | Freq: Once | INTRAVENOUS | Status: AC
Start: 2021-07-21 — End: 2021-07-21
  Administered 2021-07-21: 1000 mL via INTRAVENOUS

## 2021-07-21 MED ORDER — ADULT MULTIVITAMIN W/MINERALS CH
1.0000 | ORAL_TABLET | Freq: Every day | ORAL | Status: DC
  Administered 2021-07-21 – 2021-07-24 (×4): 1 via ORAL
  Filled 2021-07-21 (×4): qty 1

## 2021-07-21 MED ORDER — IPRATROPIUM-ALBUTEROL 0.5-2.5 (3) MG/3ML IN SOLN
3.0000 mL | Freq: Once | RESPIRATORY_TRACT | Status: AC
  Administered 2021-07-21: 3 mL via RESPIRATORY_TRACT
  Filled 2021-07-21: qty 9

## 2021-07-21 MED ORDER — AZITHROMYCIN 500 MG PO TABS
500.0000 mg | ORAL_TABLET | Freq: Every day | ORAL | Status: AC
  Administered 2021-07-21: 500 mg via ORAL
  Filled 2021-07-21: qty 1

## 2021-07-21 MED ORDER — METHYLPREDNISOLONE SODIUM SUCC 125 MG IJ SOLR
60.0000 mg | Freq: Two times a day (BID) | INTRAMUSCULAR | Status: DC
  Administered 2021-07-21 – 2021-07-24 (×6): 60 mg via INTRAVENOUS
  Filled 2021-07-21 (×6): qty 2

## 2021-07-21 MED ORDER — AZITHROMYCIN 250 MG PO TABS
250.0000 mg | ORAL_TABLET | Freq: Every day | ORAL | Status: DC
  Administered 2021-07-22 – 2021-07-24 (×3): 250 mg via ORAL
  Filled 2021-07-21 (×3): qty 1

## 2021-07-21 MED ORDER — SODIUM CHLORIDE 0.9 % IV BOLUS
1000.0000 mL | Freq: Once | INTRAVENOUS | Status: AC
  Administered 2021-07-21: 1000 mL via INTRAVENOUS

## 2021-07-21 MED ORDER — DM-GUAIFENESIN ER 30-600 MG PO TB12
1.0000 | ORAL_TABLET | Freq: Two times a day (BID) | ORAL | Status: DC | PRN
  Administered 2021-07-24: 1 via ORAL
  Filled 2021-07-21 (×2): qty 1

## 2021-07-21 MED ORDER — ACETAMINOPHEN 500 MG PO TABS
1000.0000 mg | ORAL_TABLET | Freq: Once | ORAL | Status: AC
  Administered 2021-07-21: 1000 mg via ORAL
  Filled 2021-07-21: qty 2

## 2021-07-21 MED ORDER — IPRATROPIUM-ALBUTEROL 0.5-2.5 (3) MG/3ML IN SOLN
3.0000 mL | RESPIRATORY_TRACT | Status: DC
  Administered 2021-07-21 – 2021-07-22 (×4): 3 mL via RESPIRATORY_TRACT
  Filled 2021-07-21 (×4): qty 3

## 2021-07-21 MED ORDER — ACETAMINOPHEN 325 MG PO TABS
650.0000 mg | ORAL_TABLET | Freq: Four times a day (QID) | ORAL | Status: DC | PRN
  Administered 2021-07-21 – 2021-07-24 (×3): 650 mg via ORAL
  Filled 2021-07-21 (×3): qty 2

## 2021-07-21 NOTE — Progress Notes (Signed)
PHARMACIST - PHYSICIAN COMMUNICATION  CONCERNING:  Enoxaparin (Lovenox) for DVT Prophylaxis    RECOMMENDATION: Patient was prescribed enoxaprin 40mg  q24 hours for VTE prophylaxis.   Filed Weights   07/20/21 2006  Weight: 136.1 kg (300 lb)    Body mass index is 45.61 kg/m.  Estimated Creatinine Clearance: 147.9 mL/min (by C-G formula based on SCr of 0.73 mg/dL).   Based on [facility] policy patient is candidate for enoxaparin 0.5mg /kg TBW SQ every 24 hours based on BMI being >30.  DESCRIPTION: Pharmacy has adjusted enoxaparin dose per [facility] policy.  Patient is now receiving enoxaparin 67.5 mg every 24 hours    [facility], PharmD Clinical Pharmacist  07/21/2021 8:47 AM

## 2021-07-21 NOTE — ED Notes (Signed)
Patient transported to CT 

## 2021-07-21 NOTE — ED Notes (Signed)
Pregnancy test negative

## 2021-07-21 NOTE — ED Provider Notes (Signed)
[facility] Provider Note    Event Date/Time   First MD Initiated Contact with Patient 07/21/21 0400     (approximate)   History   Shortness of Breath   HPI  Michelle Wallace is a 31 y.o. female who presents to the ED from home with a chief complaint of chest heaviness as of breath for several days.  Patient reports a 1 week history of cough, congestion.  Denies personal history of asthma or COPD.  Patient noted to be hypoxic at 86% on room air in triage after ambulating from parking lot.  Audible wheezing noted.  Denies abdominal pain, nausea, vomiting or diarrhea.  Denies recent travel, trauma or hormone use.     Past Medical History  History reviewed. No pertinent past medical history.   Active Problem List   Patient Active Problem List   Diagnosis Date Noted   Acute asthma exacerbation 07/21/2021   Seizures (HCC) 11/11/2017     Past Surgical History   Past Surgical History:  Procedure Laterality Date   CESAREAN SECTION     KNEE SURGERY Left    right salpingectomy       Home Medications   Prior to Admission medications   Medication Sig Start Date End Date Taking? Authorizing Provider  doxycycline (VIBRAMYCIN) 100 MG capsule Take 1 capsule (100 mg total) by mouth 2 (two) times daily. 03/08/20   Tommie Samsook, Jayce G, DO     Allergies  Patient has no known allergies.   Family History   Family History  Problem Relation Age of Onset   Healthy Mother    Kidney Stones Father      Physical Exam  Triage Vital Signs: ED Triage Vitals  Enc Vitals Group     BP 07/20/21 2005 (!) 147/91     Pulse Rate 07/20/21 2005 77     Resp 07/20/21 2005 (!) 22     Temp 07/20/21 2005 99.2 F (37.3 C)     Temp Source 07/20/21 2005 Oral     SpO2 07/20/21 2003 (!) 85 %     Weight 07/20/21 2006 300 lb (136.1 kg)     Height 07/20/21 2006 5\' 8"  (1.727 m)     Head Circumference --      Peak Flow --      Pain Score 07/20/21 2006 3     Pain Loc --       Pain Edu? --      Excl. in GC? --     Updated Vital Signs: BP (!) 153/71 (BP Location: Left Arm)    Pulse 74    Temp 98.6 F (37 C) (Oral)    Resp 18    Ht 5\' 8"  (1.727 m)    Wt 136.1 kg    LMP 06/21/2021 (Approximate) Comment: negative pregnancy test   SpO2 94%    BMI 45.61 kg/m    General: Awake, mild distress.  CV:  Good peripheral perfusion.  Resp:  Increased effort.  Audible diffuse wheezing. Abd:  No distention.  Other:     ED Results / Procedures / Treatments  Labs (all labs ordered are listed, but only abnormal results are displayed) Labs Reviewed  CBC WITH DIFFERENTIAL/PLATELET - Abnormal; Notable for the following components:      Result Value   WBC 14.1 (*)    Neutro Abs 8.1 (*)    Eosinophils Absolute 1.3 (*)    All other components within normal limits  RESP PANEL BY RT-PCR (  FLU A&B, COVID) [id number]  COMPREHENSIVE METABOLIC PANEL  POC URINE PREG, ED  TROPONIN I (HIGH SENSITIVITY)  TROPONIN I (HIGH SENSITIVITY)     EKG  ED ECG REPORT I, Rhiannan Kievit J, the attending physician, personally viewed and interpreted this ECG.   Date: 07/21/2021  EKG Time: 2009  Rate: 70  Rhythm: normal sinus rhythm  Axis: Normal  Intervals:none  ST&T Change: Nonspecific    RADIOLOGY ED interpretation: I have personally reviewed patient's chest x-ray as well as radiology interpretation:  No acute cardiopulmonary disease   Official radiology report(s): DG Chest 2 View  Result Date: 07/20/2021 CLINICAL DATA:  uri symptoms x 1 week. SOB with chest tightness, hypoxic, tachypnea EXAM: CHEST - 2 VIEW COMPARISON:  None. FINDINGS: The heart and mediastinal contours are within normal limits. No focal consolidation. No pulmonary edema. No pleural effusion. No pneumothorax. No acute osseous abnormality. IMPRESSION: No active cardiopulmonary disease. Electronically Signed   By: Tish Frederickson M.D.   On: 07/20/2021 20:41   CT Angio Chest PE W/Cm &/Or Wo Cm  Result Date:  07/21/2021 CLINICAL DATA:  Shortness of breath with chest tightness. Hypoxia and tachypnea. EXAM: CT ANGIOGRAPHY CHEST WITH CONTRAST TECHNIQUE: Multidetector CT imaging of the chest was performed using the standard protocol during bolus administration of intravenous contrast. Multiplanar CT image reconstructions and MIPs were obtained to evaluate the vascular anatomy. CONTRAST:  OMNIPAQUE IOHEXOL 350 MG/ML SOLN COMPARISON:  None. FINDINGS: Cardiovascular: The heart size is normal. No substantial pericardial effusion. There is no filling defect within the opacified pulmonary arteries to suggest the presence of an acute pulmonary embolus. Mediastinum/Nodes: No mediastinal lymphadenopathy. Ill-defined subtle soft tissue attenuation anterior mediastinum is compatible with thymic remnant. There is no hilar lymphadenopathy. The esophagus has normal imaging features. There is no axillary lymphadenopathy. Lungs/Pleura: Scattered small patchy areas of ground-glass opacity are seen in both lungs, nonspecific but potentially related to an infectious/inflammatory alveolitis. No dense focal airspace consolidation. No pulmonary edema or pleural effusion. Upper Abdomen: Unremarkable. Musculoskeletal: No worrisome lytic or sclerotic osseous abnormality. Review of the MIP images confirms the above findings. IMPRESSION: 1. No CT evidence for acute pulmonary embolus. 2. Scattered small patchy areas of ground-glass opacity in both lungs, nonspecific but potentially related to an infectious/inflammatory alveolitis. Electronically Signed   By: Kennith Center M.D.   On: 07/21/2021 06:02     PROCEDURES:  Critical Care performed: No  .1-3 Lead EKG Interpretation Performed by: Irean Hong, MD Authorized by: Irean Hong, MD     Interpretation: normal     ECG rate:  74   ECG rate assessment: normal     Rhythm: sinus rhythm     Ectopy: none     Conduction: normal   Comments:     Patient placed on cardiac monitor to  evaluate for arrhythmias   MEDICATIONS ORDERED IN ED: Medications  ipratropium-albuterol (DUONEB) 0.5-2.5 (3) MG/3ML nebulizer solution 3 mL (3 mLs Nebulization Given 07/20/21 2016)  ipratropium-albuterol (DUONEB) 0.5-2.5 (3) MG/3ML nebulizer solution 3 mL (3 mLs Nebulization Given 07/21/21 0539)  methylPREDNISolone sodium succinate (SOLU-MEDROL) 125 mg/2 mL injection 125 mg (125 mg Intravenous Given 07/21/21 0519)  sodium chloride 0.9 % bolus 1,000 mL (1,000 mLs Intravenous New Bag/Given 07/21/21 0537)  iohexol (OMNIPAQUE) 350 MG/ML injection 100 mL (100 mLs Intravenous Contrast Given 07/21/21 0523)  acetaminophen (TYLENOL) tablet 1,000 mg (1,000 mg Oral Given 07/21/21 0536)     IMPRESSION / MDM / ASSESSMENT AND PLAN / ED COURSE  I  reviewed the triage vital signs and the nursing notes.                              31 year old female with cold-like symptoms presenting with chest pain and shortness of breath. Differential includes, but is not limited to, viral syndrome, bronchitis including COPD exacerbation, pneumonia, reactive airway disease including asthma, CHF including exacerbation with or without pulmonary/interstitial edema, pneumothorax, ACS, thoracic trauma, and pulmonary embolism.   The patient is on the cardiac monitor to evaluate for evidence of arrhythmia and/or significant heart rate changes.  I have personally reviewed patient's chart and see some urgent care visits, STD screenings, GYN surgery 05/09/2019.  Laboratory results remarkable for moderate leukocytosis.  Respiratory panel and chest x-ray were unremarkable.  Obtain CTA chest which is negative for PE.  Will initiate IV fluid resuscitation, IV Solu-Medrol, DuoNeb wheezing.  Tylenol administered for headache.  Clinical Course as of 07/21/21 0643  Tue Jul 21, 2021  0620 Discussed case in consultation with with hospitalist services who will evaluate patient in the emergency department for admission for wheezing associated  respiratory infection with hypoxia. [JS]    Clinical Course User Index [JS] Irean Hong, MD     FINAL CLINICAL IMPRESSION(S) / ED DIAGNOSES   Final diagnoses:  Shortness of breath  Hypoxia  Wheezing-associated respiratory infection (WARI)     Rx / DC Orders   ED Discharge Orders     None        Note:  This document was prepared using Dragon voice recognition software and may include unintentional dictation errors.   Irean Hong, MD 07/21/21 [phone]

## 2021-07-21 NOTE — H&P (Signed)
Note  History and Physical    Michelle Wallace [id number] DOB: 1990-11-12 DOA: 07/21/2021  Referring MD/NP/PA:   PCP: Patient, No Pcp Per (Inactive)   Patient coming from:  The patient is coming from home.  At baseline, pt is independent for most of ADL.        Chief Complaint: Cough, shortness of breath, wheezing  HPI: Michelle Wallace is a 31 y.o. female with medical history significant of remote seizure (did not have seizure since 2016), who presents with cough, shortness of breath, wheezing.  Patient states that she has been sick for more 1 week.  She has cough with thick white mucus production, shortness breath, wheezing.  No history of COPD or asthma.  No fever or chills.  She states she had some chest discomfort yesterday, which has resolved.  She states that her cough and shortness of have been progressively worsening.  Patient cannot speak in full sentence due to coughing. Denies nausea, vomiting, diarrhea or abdominal pain.  No symptoms of UTI.  Patient was found to have oxygen desaturated to 85% on room air, which improved to 94% on 2 L oxygen.  ED Course: pt was found to have WBC 14.1, troponin negative x2, negative COVID PCR, electrolytes renal function okay, temperature normal, blood pressure 126/81, 153/71, heart rate 82, RR 22, chest x-ray negative for infiltration.  CT angiogram is negative for PE, but showed bilateral groundglass patchy infiltration.  Patient is admitted to [place] bed as inpatient  Review of Systems:   General: no fevers, chills, no body weight gain, has fatigue HEENT: no blurry vision, hearing changes or sore throat Respiratory: has dyspnea, coughing, wheezing CV: had chest discomfort, no palpitations GI: no nausea, vomiting, abdominal pain, diarrhea, constipation GU: no dysuria, burning on urination, increased urinary frequency, hematuria  Ext: no leg edema Neuro: no unilateral weakness, numbness, or tingling, no vision change or hearing  loss Skin: no rash, no skin tear. MSK: No muscle spasm, no deformity, no limitation of range of movement in spin Heme: No easy bruising.  Travel history: No recent long distant travel.  Allergy: No Known Allergies  Past Medical History:  Diagnosis Date   Seizure [facility])     Past Surgical History:  Procedure Laterality Date   CESAREAN SECTION     KNEE SURGERY Left    right salpingectomy      Social History:  reports that she has never smoked. She has never used smokeless tobacco. She reports current alcohol use. She reports that she does not use drugs.  Family History:  Family History  Problem Relation Age of Onset   Healthy Mother    Kidney Stones Father      Prior to Admission medications   Medication Sig Start Date End Date Taking? Authorizing Provider  doxycycline (VIBRAMYCIN) 100 MG capsule Take 1 capsule (100 mg total) by mouth 2 (two) times daily. 03/08/20   Coral Spikes, DO    Physical Exam: Vitals:   07/20/21 2318 07/21/21 0215 07/21/21 0352 07/21/21 1135  BP: 126/81 (!) 153/71  (!) 111/48  Pulse: 82 74  77  Resp: 18 18  18   Temp: 98.6 F (37 C) 98.6 F (37 C)    TempSrc: Oral Oral    SpO2: 91% 92% 94% 96%  Weight:      Height:       General: Not in acute distress HEENT:       Eyes: PERRL, EOMI, no scleral icterus.  ENT: No discharge from the ears and nose, no pharynx injection, no tonsillar enlargement.        Neck: No JVD, no bruit, no mass felt. Heme: No neck lymph node enlargement. Cardiac: S1/S2, RRR, No murmurs, No gallops or rubs. Respiratory: Has wheezing bilaterally GI: Soft, nondistended, nontender, no rebound pain, no organomegaly, BS present. GU: No hematuria Ext: No pitting leg edema bilaterally. 1+DP/PT pulse bilaterally. Musculoskeletal: No joint deformities, No joint redness or warmth, no limitation of ROM in spin. Skin: No rashes.  Neuro: Alert, oriented X3, cranial nerves II-XII grossly intact, moves all extremities normally.   Psych: Patient is not psychotic, no suicidal or hemocidal ideation.  Labs on Admission: I have personally reviewed following labs and imaging studies  CBC: Recent Labs  Lab 07/20/21 2013  WBC 14.1*  NEUTROABS 8.1*  HGB 14.0  HCT 42.5  MCV 93.0  PLT 123456   Basic Metabolic Panel: Recent Labs  Lab 07/20/21 2013  NA 141  K 4.0  CL 107  CO2 28  GLUCOSE 99  BUN 13  CREATININE 0.73  CALCIUM 9.1   GFR: Estimated Creatinine Clearance: 147.9 mL/min (by C-G formula based on SCr of 0.73 mg/dL). Liver Function Tests: Recent Labs  Lab 07/20/21 2013  AST 33  ALT 40  ALKPHOS 88  BILITOT 0.6  PROT 7.7  ALBUMIN 3.8   No results for input(s): LIPASE, AMYLASE in the last 168 hours. No results for input(s): AMMONIA in the last 168 hours. Coagulation Profile: No results for input(s): INR, PROTIME in the last 168 hours. Cardiac Enzymes: No results for input(s): CKTOTAL, CKMB, CKMBINDEX, TROPONINI in the last 168 hours. BNP (last 3 results) No results for input(s): PROBNP in the last 8760 hours. HbA1C: No results for input(s): HGBA1C in the last 72 hours. CBG: No results for input(s): GLUCAP in the last 168 hours. Lipid Profile: No results for input(s): CHOL, HDL, LDLCALC, TRIG, CHOLHDL, LDLDIRECT in the last 72 hours. Thyroid Function Tests: No results for input(s): TSH, T4TOTAL, FREET4, T3FREE, THYROIDAB in the last 72 hours. Anemia Panel: No results for input(s): VITAMINB12, FOLATE, FERRITIN, TIBC, IRON, RETICCTPCT in the last 72 hours. Urine analysis:    Component Value Date/Time   COLORURINE YELLOW 02/26/2019 1300   APPEARANCEUR HAZY (A) 02/26/2019 1300   LABSPEC >1.030 (H) 02/26/2019 1300   PHURINE 5.5 02/26/2019 1300   GLUCOSEU NEGATIVE 02/26/2019 1300   HGBUR LARGE (A) 02/26/2019 1300   BILIRUBINUR SMALL (A) 02/26/2019 1300   KETONESUR TRACE (A) 02/26/2019 1300   PROTEINUR TRACE (A) 02/26/2019 1300   NITRITE NEGATIVE 02/26/2019 1300   LEUKOCYTESUR TRACE (A)  02/26/2019 1300   Sepsis Labs: @LABRCNTIP (procalcitonin:4,lacticidven:4) ) Recent Results (from the past 240 hour(s))  Resp Panel by RT-PCR (Flu A&B, Covid) Nasopharyngeal Swab     Status: None   Collection Time: 07/20/21  8:13 PM   Specimen: Nasopharyngeal Swab; Nasopharyngeal(NP) swabs in vial transport medium  Result Value Ref Range Status   SARS Coronavirus 2 by RT PCR NEGATIVE NEGATIVE Final    Comment: (NOTE) SARS-CoV-2 target nucleic acids are NOT DETECTED.  The SARS-CoV-2 RNA is generally detectable in upper respiratory specimens during the acute phase of infection. The lowest concentration of SARS-CoV-2 viral copies this assay can detect is 138 copies/mL. A negative result does not preclude SARS-Cov-2 infection and should not be used as the sole basis for treatment or other patient management decisions. A negative result may occur with  improper specimen collection/handling, submission of specimen other than  nasopharyngeal swab, presence of viral mutation(s) within the areas targeted by this assay, and inadequate number of viral copies(<138 copies/mL). A negative result must be combined with clinical observations, patient history, and epidemiological information. The expected result is Negative.  Fact Sheet for Patients:  BloggerCourse.com  Fact Sheet for Healthcare Providers:  SeriousBroker.it  This test is no t yet approved or cleared by the [place] FDA and  has been authorized for detection and/or diagnosis of SARS-CoV-2 by FDA under an Emergency Use Authorization (EUA). This EUA will remain  in effect (meaning this test can be used) for the duration of the COVID-19 declaration under Section 564(b)(1) of the Act, 21 U.S.C.section 360bbb-3(b)(1), unless the authorization is terminated  or revoked sooner.       Influenza A by PCR NEGATIVE NEGATIVE Final   Influenza B by PCR NEGATIVE NEGATIVE Final     Comment: (NOTE) The Xpert Xpress SARS-CoV-2/FLU/RSV plus assay is intended as an aid in the diagnosis of influenza from Nasopharyngeal swab specimens and should not be used as a sole basis for treatment. Nasal washings and aspirates are unacceptable for Xpert Xpress SARS-CoV-2/FLU/RSV testing.  Fact Sheet for Patients: BloggerCourse.com  Fact Sheet for Healthcare Providers: SeriousBroker.it  This test is not yet approved or cleared by the [place] FDA and has been authorized for detection and/or diagnosis of SARS-CoV-2 by FDA under an Emergency Use Authorization (EUA). This EUA will remain in effect (meaning this test can be used) for the duration of the COVID-19 declaration under Section 564(b)(1) of the Act, 21 U.S.C. section 360bbb-3(b)(1), unless the authorization is terminated or revoked.  Performed at [facility], [address], [place], [place] [address]      Radiological Exams on Admission: DG Chest 2 View  Result Date: 07/20/2021 CLINICAL DATA:  uri symptoms x 1 week. SOB with chest tightness, hypoxic, tachypnea EXAM: CHEST - 2 VIEW COMPARISON:  None. FINDINGS: The heart and mediastinal contours are within normal limits. No focal consolidation. No pulmonary edema. No pleural effusion. No pneumothorax. No acute osseous abnormality. IMPRESSION: No active cardiopulmonary disease. Electronically Signed   By: Tish Frederickson M.D.   On: 07/20/2021 20:41   CT Angio Chest PE W/Cm &/Or Wo Cm  Result Date: 07/21/2021 CLINICAL DATA:  Shortness of breath with chest tightness. Hypoxia and tachypnea. EXAM: CT ANGIOGRAPHY CHEST WITH CONTRAST TECHNIQUE: Multidetector CT imaging of the chest was performed using the standard protocol during bolus administration of intravenous contrast. Multiplanar CT image reconstructions and MIPs were obtained to evaluate the vascular anatomy. CONTRAST:  OMNIPAQUE IOHEXOL 350 MG/ML SOLN  COMPARISON:  None. FINDINGS: Cardiovascular: The heart size is normal. No substantial pericardial effusion. There is no filling defect within the opacified pulmonary arteries to suggest the presence of an acute pulmonary embolus. Mediastinum/Nodes: No mediastinal lymphadenopathy. Ill-defined subtle soft tissue attenuation anterior mediastinum is compatible with thymic remnant. There is no hilar lymphadenopathy. The esophagus has normal imaging features. There is no axillary lymphadenopathy. Lungs/Pleura: Scattered small patchy areas of ground-glass opacity are seen in both lungs, nonspecific but potentially related to an infectious/inflammatory alveolitis. No dense focal airspace consolidation. No pulmonary edema or pleural effusion. Upper Abdomen: Unremarkable. Musculoskeletal: No worrisome lytic or sclerotic osseous abnormality. Review of the MIP images confirms the above findings. IMPRESSION: 1. No CT evidence for acute pulmonary embolus. 2. Scattered small patchy areas of ground-glass opacity in both lungs, nonspecific but potentially related to an infectious/inflammatory alveolitis. Electronically Signed   By: Jamison Oka.D.  On: 07/21/2021 06:02     EKG: I have personally reviewed.  Sinus rhythm, QTC 421, low voltage.  Assessment/Plan Principal Problem:   Acute bronchitis Active Problems:   Sepsis ([place])   Atypical pneumonia   Acute respiratory failure with hypoxia (HCC)  Acute respiratory failure with hypoxia and sepsis due to acute bronchitis and atypical pneumonia: Patient does not have history of asthma or COPD.  Non-smoker, patient has wheezing on auscultation, indicating possible acute bronchitis.  CT angiogram is negative for PE, but it showed bilateral groundglass infiltration, indicating possible atypical pneumonia.  Patient meets criteria for sepsis with WBC 14.1, tachypnea with RR 22.  Lactic acid is normal.  Currently hemodynamically stable.  Patient has new 2 L oxygen  requirement.  - Will admit to med-surg bed as inpt - Z pak - Mucinex for cough  - Bronchodilators - Urine legionella and S. pneumococcal antigen - Follow up blood culture x2, sputum culture - Respiratory virus panel - will get Procalcitonin - IVF: 1L of NS bolus     DVT ppx: sq Lovenox Code Status: Full code Family Communication: not done, no family member is at bed side.   Disposition Plan:  Anticipate discharge back to previous environment Consults called:  none Admission status and Level of care: Med-Surg:   as inpt     Status is: Inpatient  Remains inpatient appropriate because: Patient presents with acute respiratory failure with 2 L new oxygen requirement due to acute bronchitis and atypical pneumonia.  Patient also meets criteria for sepsis.  Her presentation is highly complicated.  Patient is at high risk of deteriorating.  Need to be treated in the hospital for the last 2 days          Date of Service 07/21/2021    [facility]   If 7PM-7AM, please contact night-coverage [email or web address] 07/21/2021, 1:02 PM

## 2021-07-22 LAB — CBC
HCT: 40.3 % (ref 36.0–46.0)
Hemoglobin: 13.5 g/dL (ref 12.0–15.0)
MCH: 31.3 pg (ref 26.0–34.0)
MCHC: 33.5 g/dL (ref 30.0–36.0)
MCV: 93.5 fL (ref 80.0–100.0)
Platelets: 306 10*3/uL (ref 150–400)
RBC: 4.31 MIL/uL (ref 3.87–5.11)
RDW: 13.5 % (ref 11.5–15.5)
WBC: 18.2 10*3/uL — ABNORMAL HIGH (ref 4.0–10.5)
nRBC: 0 % (ref 0.0–0.2)

## 2021-07-22 LAB — STREP PNEUMONIAE URINARY ANTIGEN: Strep Pneumo Urinary Antigen: NEGATIVE

## 2021-07-22 MED ORDER — IPRATROPIUM-ALBUTEROL 0.5-2.5 (3) MG/3ML IN SOLN
3.0000 mL | Freq: Three times a day (TID) | RESPIRATORY_TRACT | Status: DC
  Administered 2021-07-22 – 2021-07-24 (×7): 3 mL via RESPIRATORY_TRACT
  Filled 2021-07-22 (×7): qty 3

## 2021-07-22 NOTE — Progress Notes (Signed)
PROGRESS NOTE    Michelle SCIARRETTA  [id number] DOB: May 15, 1991 DOA: 07/21/2021 PCP: Patient, No Pcp Per (Inactive)   Chief Complaint  Patient presents with   Shortness of Breath  Brief Narrative/Hospital Course:  Michelle Wallace, 31 y.o. female with PMH of remote seizure disorder, morbid obesity presents for evaluation of cough shortness of breath and wheezing going for a week recent sick contacts with her son who has [place] and has been dealing with bad weather.  She has been having chest tightness shortness of breath cough and has been progressively worsening Seen in the ED with leukocytosis troponin negative COVID-19 negative, chest x-ray no acute finding underwent CTA: no CT evidence for acute pulmonary embolus. 2. Scattered small patchy areas of ground-glass opacity in both lungs, nonspecific but potentially related to an infectious/inflammatory alveolitis. Patient was placed on IV steroids supplemental oxygen and admitted.  Subjective: Seen and examined.  Patient reports She feels whole lot better still needing oxygen has mild wheezing, no new complaints.  Assessment & Plan:  Acute hypoxic respiratory failure Acute bronchitis Atypical pneumonia: Imaging with no PE but groundglass opacities in both lungs patchy areas of concern for atypical infection.  Continue with current IV steroids supplemental oxygen bronchodilators and IV antibiotics.  Clinically feeling much better.  Blood culture no growth so far.  Respiratory virus panel negative.  Procalcitonin reassuring.  Legionella pending, strep pneumonia antigen negative.  Wean oxygen as tolerated.  Leukocytosis with reassuring procalcitonin likely from steroid.  On antibiotics as above.  Check CBC in 1 week  Class III Obesity:Patient's Body mass index is 45.61 kg/m. : Will benefit with PCP follow-up, weight loss  healthy lifestyle and outpatient sleep evaluation-this was discussed with the patient.  DVT prophylaxis:  lovenox Code Status:   Code Status: Full Code Family Communication: plan of care discussed with patient at bedside. Status is: Inpatient  Remains inpatient appropriate because: Ongoing management of respiratory failure   Disposition: Currently nog medically stable for discharge. Anticipated Disposition: Home  Total time spent in the care of this patient 25 MINUTES Objective: Vitals last 24 hrs: Vitals:   07/22/21 0032 07/22/21 0425 07/22/21 0822 07/22/21 1154  BP:  119/70 132/66 (!) 108/58  Pulse:  72 95 89  Resp:  17 18 18   Temp:   98.4 F (36.9 C) 98.4 F (36.9 C)  TempSrc:      SpO2: 94% 94% 90% 91%  Weight:      Height:       Weight change:   Intake/Output Summary (Last 24 hours) at 07/22/2021 1451 Last data filed at 07/22/2021 1403 Gross per 24 hour  Intake 600 ml  Output --  Net 600 ml   Net IO Since Admission: 600 mL [07/22/21 1451]   Physical Examination: General exam: Aa0x3, obese, weak,older than stated age. HEENT:Oral mucosa moist, Ear/Nose WNL grossly,dentition normal. Respiratory system: B/l diminished with end expiratory wheezing BS, no use of accessory muscle, non tender. Cardiovascular system: S1 & S2 +,No JVD. Gastrointestinal system: Abdomen soft, NT,ND, BS+. Nervous System:Alert, awake, moving extremities. Extremities: edema none, distal peripheral pulses palpable.  Skin: No rashes, no icterus. MSK: Normal muscle bulk, tone, power.  Medications reviewed:  Scheduled Meds:  azithromycin  250 mg Oral Daily   enoxaparin (LOVENOX) injection  0.5 mg/kg Subcutaneous Q24H   ipratropium-albuterol  3 mL Nebulization TID   methylPREDNISolone (SOLU-MEDROL) injection  60 mg Intravenous Q12H   multivitamin with minerals  1 tablet Oral Daily   Continuous Infusions:  Diet Order             Diet regular Room service appropriate? Yes; Fluid consistency: Thin  Diet effective now                  Weight change:   Wt Readings from Last 3 Encounters:   07/20/21 136.1 kg  03/25/21 (!) 136.6 kg  03/08/20 127 kg     Consultants:see note  Procedures:see note Antimicrobials: Anti-infectives (From admission, onward)    Start     Dose/Rate Route Frequency Ordered Stop   07/22/21 1000  azithromycin (ZITHROMAX) tablet 250 mg       See Hyperspace for full Linked Orders Report.   250 mg Oral Daily 07/21/21 0841 07/26/21 0959   07/21/21 1000  azithromycin (ZITHROMAX) tablet 500 mg       See Hyperspace for full Linked Orders Report.   500 mg Oral Daily 07/21/21 0841 07/21/21 0906      Culture/Microbiology    Component Value Date/Time   SDES BLOOD RIGHT HAND 07/21/2021 2147   SPECREQUEST  07/21/2021 2147    BOTTLES DRAWN AEROBIC AND ANAEROBIC Blood Culture adequate volume   CULT  07/21/2021 2147    NO GROWTH < 12 HOURS Performed at [facility], [address] [place], [place] [address]    REPTSTATUS PENDING 07/21/2021 2147    Other culture-see note  Unresulted Labs (From admission, onward)     Start     Ordered   07/21/21 0844  Expectorated Sputum Assessment w Gram Stain, Rflx to Resp Cult  (COPD / Pneumonia / Cellulitis / Lower Extremity Wound)  Once,   STAT        07/21/21 0844   07/21/21 0844  Legionella Pneumophila Serogp 1 Ur Ag  (COPD / Pneumonia / Cellulitis / Lower Extremity Wound)  Once,   STAT        07/21/21 0844          Data Reviewed: I have personally reviewed following labs and imaging studies CBC: Recent Labs  Lab 07/20/21 2013 07/22/21 0455  WBC 14.1* 18.2*  NEUTROABS 8.1*  --   HGB 14.0 13.5  HCT 42.5 40.3  MCV 93.0 93.5  PLT 375 [id number]   Basic Metabolic Panel: Recent Labs  Lab 07/20/21 2013  NA 141  K 4.0  CL 107  CO2 28  GLUCOSE 99  BUN 13  CREATININE 0.73  CALCIUM 9.1   GFR: Estimated Creatinine Clearance: 147.9 mL/min (by C-G formula based on SCr of 0.73 mg/dL). Liver Function Tests: Recent Labs  Lab 07/20/21 2013  AST 33  ALT 40  ALKPHOS 88  BILITOT 0.6  PROT 7.7   ALBUMIN 3.8   No results for input(s): LIPASE, AMYLASE in the last 168 hours. No results for input(s): AMMONIA in the last 168 hours. Coagulation Profile: No results for input(s): INR, PROTIME in the last 168 hours. Cardiac Enzymes: No results for input(s): CKTOTAL, CKMB, CKMBINDEX, TROPONINI in the last 168 hours. BNP (last 3 results) No results for input(s): PROBNP in the last 8760 hours. HbA1C: No results for input(s): HGBA1C in the last 72 hours. CBG: No results for input(s): GLUCAP in the last 168 hours. Lipid Profile: No results for input(s): CHOL, HDL, LDLCALC, TRIG, CHOLHDL, LDLDIRECT in the last 72 hours. Thyroid Function Tests: No results for input(s): TSH, T4TOTAL, FREET4, T3FREE, THYROIDAB in the last 72 hours. Anemia Panel: No results for input(s): VITAMINB12, FOLATE, FERRITIN, TIBC, IRON, RETICCTPCT in the last 72  hours. Sepsis Labs: Recent Labs  Lab 07/21/21 0909  PROCALCITON <0.10  LATICACIDVEN 1.7    Recent Results (from the past 240 hour(s))  Resp Panel by RT-PCR (Flu A&B, Covid) Nasopharyngeal Swab     Status: None   Collection Time: 07/20/21  8:13 PM   Specimen: Nasopharyngeal Swab; Nasopharyngeal(NP) swabs in vial transport medium  Result Value Ref Range Status   SARS Coronavirus 2 by RT PCR NEGATIVE NEGATIVE Final    Comment: (NOTE) SARS-CoV-2 target nucleic acids are NOT DETECTED.  The SARS-CoV-2 RNA is generally detectable in upper respiratory specimens during the acute phase of infection. The lowest concentration of SARS-CoV-2 viral copies this assay can detect is 138 copies/mL. A negative result does not preclude SARS-Cov-2 infection and should not be used as the sole basis for treatment or other patient management decisions. A negative result may occur with  improper specimen collection/handling, submission of specimen other than nasopharyngeal swab, presence of viral mutation(s) within the areas targeted by this assay, and inadequate number  of viral copies(<138 copies/mL). A negative result must be combined with clinical observations, patient history, and epidemiological information. The expected result is Negative.  Fact Sheet for Patients:  EntrepreneurPulse.com.au  Fact Sheet for Healthcare Providers:  IncredibleEmployment.be  This test is no t yet approved or cleared by the [place] FDA and  has been authorized for detection and/or diagnosis of SARS-CoV-2 by FDA under an Emergency Use Authorization (EUA). This EUA will remain  in effect (meaning this test can be used) for the duration of the COVID-19 declaration under Section 564(b)(1) of the Act, 21 U.S.C.section 360bbb-3(b)(1), unless the authorization is terminated  or revoked sooner.       Influenza A by PCR NEGATIVE NEGATIVE Final   Influenza B by PCR NEGATIVE NEGATIVE Final    Comment: (NOTE) The Xpert Xpress SARS-CoV-2/FLU/RSV plus assay is intended as an aid in the diagnosis of influenza from Nasopharyngeal swab specimens and should not be used as a sole basis for treatment. Nasal washings and aspirates are unacceptable for Xpert Xpress SARS-CoV-2/FLU/RSV testing.  Fact Sheet for Patients: EntrepreneurPulse.com.au  Fact Sheet for Healthcare Providers: IncredibleEmployment.be  This test is not yet approved or cleared by the [place] FDA and has been authorized for detection and/or diagnosis of SARS-CoV-2 by FDA under an Emergency Use Authorization (EUA). This EUA will remain in effect (meaning this test can be used) for the duration of the COVID-19 declaration under Section 564(b)(1) of the Act, 21 U.S.C. section 360bbb-3(b)(1), unless the authorization is terminated or revoked.  Performed at [facility], [address] [place], [place] [id number]   Culture, blood (x 2)     Status: None (Preliminary result)   Collection Time: 07/21/21  9:09 AM    Specimen: BLOOD  Result Value Ref Range Status   Specimen Description BLOOD RIGHT ANTECUBITAL  Final   Special Requests   Final    BOTTLES DRAWN AEROBIC AND ANAEROBIC Blood Culture adequate volume   Culture   Final    NO GROWTH < 24 HOURS Performed at [facility], [address], [place], [place] [address]    Report Status PENDING  Incomplete  Respiratory (~20 pathogens) panel by PCR     Status: None   Collection Time: 07/21/21  2:15 PM   Specimen: Nasopharyngeal Swab; Respiratory  Result Value Ref Range Status   Adenovirus NOT DETECTED NOT DETECTED Final   Coronavirus 229E NOT DETECTED NOT DETECTED Final    Comment: (NOTE) The  Coronavirus on the Respiratory Panel, DOES NOT test for the novel  Coronavirus (2019 nCoV)    Coronavirus HKU1 NOT DETECTED NOT DETECTED Final   Coronavirus NL63 NOT DETECTED NOT DETECTED Final   Coronavirus OC43 NOT DETECTED NOT DETECTED Final   Metapneumovirus NOT DETECTED NOT DETECTED Final   Rhinovirus / Enterovirus NOT DETECTED NOT DETECTED Final   Influenza A NOT DETECTED NOT DETECTED Final   Influenza B NOT DETECTED NOT DETECTED Final   Parainfluenza Virus 1 NOT DETECTED NOT DETECTED Final   Parainfluenza Virus 2 NOT DETECTED NOT DETECTED Final   Parainfluenza Virus 3 NOT DETECTED NOT DETECTED Final   Parainfluenza Virus 4 NOT DETECTED NOT DETECTED Final   Respiratory Syncytial Virus NOT DETECTED NOT DETECTED Final   Bordetella pertussis NOT DETECTED NOT DETECTED Final   Bordetella Parapertussis NOT DETECTED NOT DETECTED Final   Chlamydophila pneumoniae NOT DETECTED NOT DETECTED Final   Mycoplasma pneumoniae NOT DETECTED NOT DETECTED Final    Comment: Performed at [facility], [place] [address] [place], [place] [address]  Culture, blood (Routine X 2) w Reflex to ID Panel     Status: None (Preliminary result)   Collection Time: 07/21/21  9:47 PM   Specimen: BLOOD  Result Value Ref Range Status   Specimen Description BLOOD RIGHT  HAND  Final   Special Requests   Final    BOTTLES DRAWN AEROBIC AND ANAEROBIC Blood Culture adequate volume   Culture   Final    NO GROWTH < 12 HOURS Performed at [facility], [address], [place], [place] [address]    Report Status PENDING  Incomplete     Radiology Studies: DG Chest 2 View  Result Date: 07/20/2021 CLINICAL DATA:  uri symptoms x 1 week. SOB with chest tightness, hypoxic, tachypnea EXAM: CHEST - 2 VIEW COMPARISON:  None. FINDINGS: The heart and mediastinal contours are within normal limits. No focal consolidation. No pulmonary edema. No pleural effusion. No pneumothorax. No acute osseous abnormality. IMPRESSION: No active cardiopulmonary disease. Electronically Signed   By: Iven Finn M.D.   On: 07/20/2021 20:41   CT Angio Chest PE W/Cm &/Or Wo Cm  Result Date: 07/21/2021 CLINICAL DATA:  Shortness of breath with chest tightness. Hypoxia and tachypnea. EXAM: CT ANGIOGRAPHY CHEST WITH CONTRAST TECHNIQUE: Multidetector CT imaging of the chest was performed using the standard protocol during bolus administration of intravenous contrast. Multiplanar CT image reconstructions and MIPs were obtained to evaluate the vascular anatomy. CONTRAST:  13mL OMNIPAQUE IOHEXOL 350 MG/ML SOLN COMPARISON:  None. FINDINGS: Cardiovascular: The heart size is normal. No substantial pericardial effusion. There is no filling defect within the opacified pulmonary arteries to suggest the presence of an acute pulmonary embolus. Mediastinum/Nodes: No mediastinal lymphadenopathy. Ill-defined subtle soft tissue attenuation anterior mediastinum is compatible with thymic remnant. There is no hilar lymphadenopathy. The esophagus has normal imaging features. There is no axillary lymphadenopathy. Lungs/Pleura: Scattered small patchy areas of ground-glass opacity are seen in both lungs, nonspecific but potentially related to an infectious/inflammatory alveolitis. No dense focal airspace consolidation. No  pulmonary edema or pleural effusion. Upper Abdomen: Unremarkable. Musculoskeletal: No worrisome lytic or sclerotic osseous abnormality. Review of the MIP images confirms the above findings. IMPRESSION: 1. No CT evidence for acute pulmonary embolus. 2. Scattered small patchy areas of ground-glass opacity in both lungs, nonspecific but potentially related to an infectious/inflammatory alveolitis. Electronically Signed   By: Misty Stanley M.D.   On: 07/21/2021 06:02     LOS: 1  day   Antonieta Pert, MD [facility]  07/22/2021, 2:51 PM

## 2021-07-22 NOTE — TOC Progression Note (Signed)
Transition of Care [facility]) - Progression Note    Patient Details  Name: Michelle Wallace MRN: [id number] Date of Birth: [birth year]  Transition of Care [facility]) CM/SW Contact  Marlowe Sax, RN Phone Number: 07/22/2021, 10:19 AM  Clinical Narrative:      Transition of Care (TOC) Screening Note   Patient Details  Name: Michelle Wallace Date of Birth: 11-07-1990   Transition of Care [facility]) CM/SW Contact:    Marlowe Sax, RN Phone Number: 07/22/2021, 10:19 AM    Transition of Care Department [facility] [place]) has reviewed patient and no TOC needs have been identified at this time. We will continue to monitor patient advancement through interdisciplinary progression rounds. If new patient transition needs arise, please place a TOC consult.         Expected Discharge Plan and Services                                                 Social Determinants of Health (SDOH) Interventions    Readmission Risk Interventions No flowsheet data found.

## 2021-07-23 LAB — LEGIONELLA PNEUMOPHILA SEROGP 1 UR AG: L. pneumophila Serogp 1 Ur Ag: NEGATIVE

## 2021-07-23 NOTE — Progress Notes (Signed)
PROGRESS NOTE    Michelle Wallace  TMB:[id number] DOB: Apr 20, 1991 DOA: 07/21/2021 PCP: Patient, No Pcp Per (Inactive)   Chief Complaint  Patient presents with   Shortness of Breath  Brief Narrative/Hospital Course:  Michelle Wallace, 31 y.o. female with PMH of remote seizure disorder, morbid obesity presents for evaluation of cough shortness of breath and wheezing going for a week recent sick contacts with her son who has Webb and has been dealing with bad weather.  She has been having chest tightness shortness of breath cough and has been progressively worsening Seen in the ED with leukocytosis troponin negative COVID-19 negative, chest x-ray no acute finding underwent CTA: no CT evidence for acute pulmonary embolus. 2. Scattered small patchy areas of ground-glass opacity in both lungs, nonspecific but potentially related to an infectious/inflammatory alveolitis. Patient was placed on IV steroids supplemental oxygen and admitted.  Subjective: Seen and examined this morning, doing fairly well but does have dyspnea after coming back from the restroom and also hypoxic Continue present treatment. Assessment & Plan:  Acute hypoxic respiratory failure Acute bronchitis Sepsis POA 2/2 Atypical pneumonia: Patient met sepsis criteria on admission due to tachypnea, leukocytosis, hypoxia with pneumonia.Imaging with no PE but groundglass opacities in both lungs patchy areas of concern for atypical infection.  Continue with current IV steroids supplemental oxygen bronchodilators and IV antibiotics.  Still needing oxygen continue to wean oxygen as tolerated but improving overall,Blood culture no growth so far.  Respiratory virus panel negative.  Procalcitonin reassuring.  Legionella pending, strep pneumonia antigen negative.    Leukocytosis with reassuring procalcitonin.  Continue antibiotics, steroids likely worsening her WBC count. Check CBC in 1 week  Class III Obesity:Patient's Body mass  index is 45.61 kg/m. : Will benefit with PCP follow-up, weight loss  healthy lifestyle and outpatient sleep evaluation-this was discussed with the patient.  DVT prophylaxis: lovenox Code Status:   Code Status: Full Code Family Communication: plan of care discussed with patient at bedside. Status is: Inpatient Remains inpatient appropriate because: Ongoing management of respiratory failure Disposition: Currently not medically stable for discharge. Anticipated Disposition: Home once able to cough with oxygen likely tomorrow  Total time spent in the care of this patient 25 MINUTES Objective: Vitals last 24 hrs: Vitals:   07/22/21 2326 07/23/21 0334 07/23/21 0840 07/23/21 1146  BP: 93/61 104/61 114/72 110/71  Pulse: 91 79 82 94  Resp: _0 Temp: 98.4 F (36.9 C) 98.2 F (36.8 C) 97.8 F (36.6 C) 97.8 F (36.6 C)  TempSrc:      SpO2: 91% 95% 91% 91%  Weight:      Height:       Weight change:   Intake/Output Summary (Last 24 hours) at 07/23/2021 1157 Last data filed at 07/23/2021 1029 Gross per 24 hour  Intake 840 ml  Output --  Net 840 ml    Net IO Since Admission: 1,320 mL [07/23/21 1157]   Physical Examination: General exam: AAOx 3, obese, pleasant older than stated age, weak appearing. HEENT:Oral mucosa moist, Ear/Nose WNL grossly, dentition normal. Respiratory system: bilaterally diminished, no use of accessory muscle Cardiovascular system: S1 & S2 +, No JVD,. Gastrointestinal system: Abdomen soft, NT,ND, BS+ Nervous System:Alert, awake, moving extremities and grossly nonfocal Extremities: no edema, distal peripheral pulses palpable.  Skin: No rashes,no icterus. MSK: Normal muscle bulk,tone, power   Medications reviewed:  Scheduled Meds:  azithromycin  250 mg Oral Daily   enoxaparin (LOVENOX) injection  0.5 mg/kg Subcutaneous Q24H  ipratropium-albuterol  3 mL Nebulization TID   methylPREDNISolone (SOLU-MEDROL) injection  60 mg Intravenous Q12H    multivitamin with minerals  1 tablet Oral Daily   Continuous Infusions:  Diet Order             Diet regular Room service appropriate? Yes; Fluid consistency: Thin  Diet effective now                  Weight change:   Wt Readings from Last 3 Encounters:  07/20/21 136.1 kg  03/25/21 (!) 136.6 kg  03/08/20 127 kg     Consultants:see note  Procedures:see note Antimicrobials: Anti-infectives (From admission, onward)    Start     Dose/Rate Route Frequency Ordered Stop   07/22/21 1000  azithromycin (ZITHROMAX) tablet 250 mg       See Hyperspace for full Linked Orders Report.   250 mg Oral Daily 07/21/21 0841 07/26/21 0959   07/21/21 1000  azithromycin (ZITHROMAX) tablet 500 mg       See Hyperspace for full Linked Orders Report.   500 mg Oral Daily 07/21/21 0841 07/21/21 0906      Culture/Microbiology    Component Value Date/Time   SDES BLOOD RIGHT HAND 07/21/2021 2147   SPECREQUEST  07/21/2021 2147    BOTTLES DRAWN AEROBIC AND ANAEROBIC Blood Culture adequate volume   CULT  07/21/2021 2147    NO GROWTH 2 DAYS Performed at [facility], [address] [place], [place] [address]    REPTSTATUS PENDING 07/21/2021 2147    Other culture-see note  Unresulted Labs (From admission, onward)     Start     Ordered   07/21/21 0844  Expectorated Sputum Assessment w Gram Stain, Rflx to Resp Cult  (COPD / Pneumonia / Cellulitis / Lower Extremity Wound)  Once,   STAT        07/21/21 0844   07/21/21 0844  Legionella Pneumophila Serogp 1 Ur Ag  (COPD / Pneumonia / Cellulitis / Lower Extremity Wound)  Once,   STAT        07/21/21 0844          Data Reviewed: I have personally reviewed following labs and imaging studies CBC: Recent Labs  Lab 07/20/21 2013 07/22/21 0455  WBC 14.1* 18.2*  NEUTROABS 8.1*  --   HGB 14.0 13.5  HCT 42.5 40.3  MCV 93.0 93.5  PLT 375 671    Basic Metabolic Panel: Recent Labs  Lab 07/20/21 2013  NA 141  K 4.0  CL 107  CO2 28   GLUCOSE 99  BUN 13  CREATININE 0.73  CALCIUM 9.1    GFR: Estimated Creatinine Clearance: 147.9 mL/min (by C-G formula based on SCr of 0.73 mg/dL). Liver Function Tests: Recent Labs  Lab 07/20/21 2013  AST 33  ALT 40  ALKPHOS 88  BILITOT 0.6  PROT 7.7  ALBUMIN 3.8    No results for input(s): LIPASE, AMYLASE in the last 168 hours. No results for input(s): AMMONIA in the last 168 hours. Coagulation Profile: No results for input(s): INR, PROTIME in the last 168 hours. Cardiac Enzymes: No results for input(s): CKTOTAL, CKMB, CKMBINDEX, TROPONINI in the last 168 hours. BNP (last 3 results) No results for input(s): PROBNP in the last 8760 hours. HbA1C: No results for input(s): HGBA1C in the last 72 hours. CBG: No results for input(s): GLUCAP in the last 168 hours. Lipid Profile: No results for input(s): CHOL, HDL, LDLCALC, TRIG, CHOLHDL, LDLDIRECT in the last 72 hours.  Thyroid Function Tests: No results for input(s): TSH, T4TOTAL, FREET4, T3FREE, THYROIDAB in the last 72 hours. Anemia Panel: No results for input(s): VITAMINB12, FOLATE, FERRITIN, TIBC, IRON, RETICCTPCT in the last 72 hours. Sepsis Labs: Recent Labs  Lab 07/21/21 0909  PROCALCITON <0.10  LATICACIDVEN 1.7     Recent Results (from the past 240 hour(s))  Resp Panel by RT-PCR (Flu A&B, Covid) Nasopharyngeal Swab     Status: None   Collection Time: 07/20/21  8:13 PM   Specimen: Nasopharyngeal Swab; Nasopharyngeal(NP) swabs in vial transport medium  Result Value Ref Range Status   SARS Coronavirus 2 by RT PCR NEGATIVE NEGATIVE Final    Comment: (NOTE) SARS-CoV-2 target nucleic acids are NOT DETECTED.  The SARS-CoV-2 RNA is generally detectable in upper respiratory specimens during the acute phase of infection. The lowest concentration of SARS-CoV-2 viral copies this assay can detect is 138 copies/mL. A negative result does not preclude SARS-Cov-2 infection and should not be used as the sole basis for  treatment or other patient management decisions. A negative result may occur with  improper specimen collection/handling, submission of specimen other than nasopharyngeal swab, presence of viral mutation(s) within the areas targeted by this assay, and inadequate number of viral copies(<138 copies/mL). A negative result must be combined with clinical observations, patient history, and epidemiological information. The expected result is Negative.  Fact Sheet for Patients:  EntrepreneurPulse.com.au  Fact Sheet for Healthcare Providers:  IncredibleEmployment.be  This test is no t yet approved or cleared by the [place] FDA and  has been authorized for detection and/or diagnosis of SARS-CoV-2 by FDA under an Emergency Use Authorization (EUA). This EUA will remain  in effect (meaning this test can be used) for the duration of the COVID-19 declaration under Section 564(b)(1) of the Act, 21 U.S.C.section 360bbb-3(b)(1), unless the authorization is terminated  or revoked sooner.       Influenza A by PCR NEGATIVE NEGATIVE Final   Influenza B by PCR NEGATIVE NEGATIVE Final    Comment: (NOTE) The Xpert Xpress SARS-CoV-2/FLU/RSV plus assay is intended as an aid in the diagnosis of influenza from Nasopharyngeal swab specimens and should not be used as a sole basis for treatment. Nasal washings and aspirates are unacceptable for Xpert Xpress SARS-CoV-2/FLU/RSV testing.  Fact Sheet for Patients: EntrepreneurPulse.com.au  Fact Sheet for Healthcare Providers: IncredibleEmployment.be  This test is not yet approved or cleared by the [place] FDA and has been authorized for detection and/or diagnosis of SARS-CoV-2 by FDA under an Emergency Use Authorization (EUA). This EUA will remain in effect (meaning this test can be used) for the duration of the COVID-19 declaration under Section 564(b)(1) of the Act, 21  U.S.C. section 360bbb-3(b)(1), unless the authorization is terminated or revoked.  Performed at [facility], [address] [place], [place] [address]   Culture, blood (x 2)     Status: None (Preliminary result)   Collection Time: 07/21/21  9:09 AM   Specimen: BLOOD  Result Value Ref Range Status   Specimen Description BLOOD RIGHT ANTECUBITAL  Final   Special Requests   Final    BOTTLES DRAWN AEROBIC AND ANAEROBIC Blood Culture adequate volume   Culture   Final    NO GROWTH 2 DAYS Performed at [facility], [address], [place], [place] [address]    Report Status PENDING  Incomplete  Respiratory (~20 pathogens) panel by PCR     Status: None   Collection Time: 07/21/21  2:15 PM  Specimen: Nasopharyngeal Swab; Respiratory  Result Value Ref Range Status   Adenovirus NOT DETECTED NOT DETECTED Final   Coronavirus 229E NOT DETECTED NOT DETECTED Final    Comment: (NOTE) The Coronavirus on the Respiratory Panel, DOES NOT test for the novel  Coronavirus (2019 nCoV)    Coronavirus HKU1 NOT DETECTED NOT DETECTED Final   Coronavirus NL63 NOT DETECTED NOT DETECTED Final   Coronavirus OC43 NOT DETECTED NOT DETECTED Final   Metapneumovirus NOT DETECTED NOT DETECTED Final   Rhinovirus / Enterovirus NOT DETECTED NOT DETECTED Final   Influenza A NOT DETECTED NOT DETECTED Final   Influenza B NOT DETECTED NOT DETECTED Final   Parainfluenza Virus 1 NOT DETECTED NOT DETECTED Final   Parainfluenza Virus 2 NOT DETECTED NOT DETECTED Final   Parainfluenza Virus 3 NOT DETECTED NOT DETECTED Final   Parainfluenza Virus 4 NOT DETECTED NOT DETECTED Final   Respiratory Syncytial Virus NOT DETECTED NOT DETECTED Final   Bordetella pertussis NOT DETECTED NOT DETECTED Final   Bordetella Parapertussis NOT DETECTED NOT DETECTED Final   Chlamydophila pneumoniae NOT DETECTED NOT DETECTED Final   Mycoplasma pneumoniae NOT DETECTED NOT DETECTED Final    Comment: Performed at [facility], [employer]. [address] [place], [place] [address]  Culture, blood (Routine X 2) w Reflex to ID Panel     Status: None (Preliminary result)   Collection Time: 07/21/21  9:47 PM   Specimen: BLOOD  Result Value Ref Range Status   Specimen Description BLOOD RIGHT HAND  Final   Special Requests   Final    BOTTLES DRAWN AEROBIC AND ANAEROBIC Blood Culture adequate volume   Culture   Final    NO GROWTH 2 DAYS Performed at [facility], [address] [place], [place] [address]    Report Status PENDING  Incomplete      Radiology Studies: No results found.   LOS: 2 days   Antonieta Pert, MD [facility]  07/23/2021, 11:57 AM

## 2021-07-24 MED ORDER — AZITHROMYCIN 500 MG PO TABS: 500.0000 mg | ORAL_TABLET | Freq: Every day | ORAL | 0 refills | Status: AC

## 2021-07-24 MED ORDER — ALBUTEROL SULFATE HFA 108 (90 BASE) MCG/ACT IN AERS: 2.0000 | INHALATION_SPRAY | Freq: Four times a day (QID) | RESPIRATORY_TRACT | 2 refills | Status: AC | PRN

## 2021-07-24 MED ORDER — PREDNISONE 20 MG PO TABS: 20.0000 mg | ORAL_TABLET | Freq: Every day | ORAL | 0 refills | Status: AC

## 2021-07-24 NOTE — Progress Notes (Signed)
SATURATION QUALIFICATIONS: (This note is used to comply with regulatory documentation for home oxygen)  Patient Saturations on Room Air at Rest = 94%  Patient Saturations on Room Air while Ambulating = 92%  Patient Saturations on 0 Liters of oxygen while Ambulating = 0%  Please briefly explain why patient needs home oxygen:

## 2021-07-24 NOTE — Discharge Summary (Signed)
Physician Discharge Summary  Michelle Wallace XNA:[id number] DOB: 1990-10-21 DOA: 07/21/2021  PCP: Patient, No Pcp Per (Inactive)  Admit date: 07/21/2021 Discharge date: 07/24/2021  Admitted From: Home Disposition: Home  Recommendations for Outpatient Follow-up:  Follow up with PCP in 1-2 weeks Please obtain BMP/CBC in one week   Home Health:no  Equipment/Devices: None  Discharge Condition: Stable Code Status:   Code Status: Full Code Diet recommendation:  Diet Order             Diet regular Room service appropriate? Yes; Fluid consistency: Thin  Diet effective now                    Brief/Interim Summary:  31 y.o. female with PMH of remote seizure disorder, morbid obesity presents for evaluation of cough shortness of breath and wheezing going for a week recent sick contacts with her son who has [place] and has been dealing with bad weather.  She has been having chest tightness shortness of breath cough and has been progressively worsening Seen in the ED with leukocytosis troponin negative COVID-19 negative, chest x-ray no acute finding underwent CTA: no CT evidence for acute pulmonary embolus. 2. Scattered small patchy areas of ground-glass opacity in both lungs, nonspecific but potentially related to an infectious/inflammatory alveolitis. Patient was placed on IV steroids supplemental oxygen and admitted Patient has clinically improved at this time able to come off oxygen we will send her home on oral steroid azithromycin and ProAir inhaler.  She is asymptomatic with ambulation and clinically improved  Discharge Diagnoses:  Acute hypoxic respiratory failure Acute bronchitis Sepsis POA 2/2 Atypical pneumonia: Patient met sepsis criteria on admission due to tachypnea, leukocytosis, hypoxia with pneumonia.Imaging with no PE but groundglass opacities in both lungs patchy areas of concern for atypical infection.  Managed with azithromycin IV steroids bronchodilators.  At this time  clinically improved.  She is no longer needing oxygen this morning.  She is stable for discharge home on oral antibiotics steroid and inhaler.  She will follow-up with PCP. Blood culture no growth so far.  Respiratory virus panel negative.  Procalcitonin reassuring.  Legionella and strep pneumonia antigen negative.     Leukocytosis with reassuring procalcitonin.  Continue antibiotics, steroids likely worsening her WBC count. Check CBC in 1 week   Class III Obesity:Patient's Body mass index is 45.61 kg/m. : Will benefit with PCP follow-up, weight loss  healthy lifestyle and outpatient sleep evaluation-this was discussed with the patient.  Consults: NONE  Subjective: Alert awake oriented, resting comfortably. EAGER To go home to go home Discharge Exam: Vitals:   07/24/21 0808 07/24/21 1116  BP:  112/71  Pulse: 66 100  Resp: 18 20  Temp:  97.8 F (36.6 C)  SpO2: 93% 92%   General: Pt is alert, awake, not in acute distress Cardiovascular: RRR, S1/S2 +, no rubs, no gallops Respiratory: CTA bilaterally, no wheezing, no rhonchi Abdominal: Soft, NT, ND, bowel sounds + Extremities: no edema, no cyanosis  Discharge Instructions  Discharge Instructions     Discharge instructions   Complete by: As directed    Please call call MD or return to ER for similar or worsening recurring problem that brought you to hospital or if any fever,nausea/vomiting,abdominal pain, uncontrolled pain, chest pain,  shortness of breath or any other alarming symptoms.  Please follow-up your doctor as instructed in a week time and call the office for appointment.  Please avoid alcohol, smoking, or any other illicit substance and maintain healthy  habits including taking your regular medications as prescribed.  You were cared for by a hospitalist during your hospital stay. If you have any questions about your discharge medications or the care you received while you were in the hospital after you are discharged,  you can call the unit and ask to speak with the hospitalist on call if the hospitalist that took care of you is not available.  Once you are discharged, your primary care physician will handle any further medical issues. Please note that NO REFILLS for any discharge medications will be authorized once you are discharged, as it is imperative that you return to your primary care physician (or establish a relationship with a primary care physician if you do not have one) for your aftercare needs so that they can reassess your need for medications and monitor your lab values   Increase activity slowly   Complete by: As directed       Allergies as of 07/24/2021   No Known Allergies      Medication List     TAKE these medications    albuterol 108 (90 Base) MCG/ACT inhaler Commonly known as: VENTOLIN HFA Inhale 2 puffs into the lungs every 6 (six) hours as needed for wheezing or shortness of breath.   azithromycin 500 MG tablet Commonly known as: Zithromax Take 1 tablet (500 mg total) by mouth daily for 3 days. Take 1 tablet daily for 3 days.   multivitamin with minerals tablet Take 1 tablet by mouth daily.   predniSONE 20 MG tablet Commonly known as: DELTASONE Take 1 tablet (20 mg total) by mouth daily for 5 days.        Follow-up Information     [facility] Follow up in 1 week(s).   Contact information: [place] [address] [phone]                No Known Allergies  The results of significant diagnostics from this hospitalization (including imaging, microbiology, ancillary and laboratory) are listed below for reference.    Microbiology: Recent Results (from the past 240 hour(s))  Resp Panel by RT-PCR (Flu A&B, Covid) Nasopharyngeal Swab     Status: None   Collection Time: 07/20/21  8:13 PM   Specimen: Nasopharyngeal Swab; Nasopharyngeal(NP) swabs in vial transport medium  Result Value Ref Range Status   SARS Coronavirus 2 by RT  PCR NEGATIVE NEGATIVE Final    Comment: (NOTE) SARS-CoV-2 target nucleic acids are NOT DETECTED.  The SARS-CoV-2 RNA is generally detectable in upper respiratory specimens during the acute phase of infection. The lowest concentration of SARS-CoV-2 viral copies this assay can detect is 138 copies/mL. A negative result does not preclude SARS-Cov-2 infection and should not be used as the sole basis for treatment or other patient management decisions. A negative result may occur with  improper specimen collection/handling, submission of specimen other than nasopharyngeal swab, presence of viral mutation(s) within the areas targeted by this assay, and inadequate number of viral copies(<138 copies/mL). A negative result must be combined with clinical observations, patient history, and epidemiological information. The expected result is Negative.  Fact Sheet for Patients:  EntrepreneurPulse.com.au  Fact Sheet for Healthcare Providers:  IncredibleEmployment.be  This test is no t yet approved or cleared by the [place] FDA and  has been authorized for detection and/or diagnosis of SARS-CoV-2 by FDA under an Emergency Use Authorization (EUA). This EUA will remain  in effect (meaning this test can be used) for  the duration of the COVID-19 declaration under Section 564(b)(1) of the Act, 21 U.S.C.section 360bbb-3(b)(1), unless the authorization is terminated  or revoked sooner.       Influenza A by PCR NEGATIVE NEGATIVE Final   Influenza B by PCR NEGATIVE NEGATIVE Final    Comment: (NOTE) The Xpert Xpress SARS-CoV-2/FLU/RSV plus assay is intended as an aid in the diagnosis of influenza from Nasopharyngeal swab specimens and should not be used as a sole basis for treatment. Nasal washings and aspirates are unacceptable for Xpert Xpress SARS-CoV-2/FLU/RSV testing.  Fact Sheet for Patients: EntrepreneurPulse.com.au  Fact Sheet for  Healthcare Providers: IncredibleEmployment.be  This test is not yet approved or cleared by the [place] FDA and has been authorized for detection and/or diagnosis of SARS-CoV-2 by FDA under an Emergency Use Authorization (EUA). This EUA will remain in effect (meaning this test can be used) for the duration of the COVID-19 declaration under Section 564(b)(1) of the Act, 21 U.S.C. section 360bbb-3(b)(1), unless the authorization is terminated or revoked.  Performed at [facility], [address] [place], [place] [address]   Culture, blood (x 2)     Status: None (Preliminary result)   Collection Time: 07/21/21  9:09 AM   Specimen: BLOOD  Result Value Ref Range Status   Specimen Description BLOOD RIGHT ANTECUBITAL  Final   Special Requests   Final    BOTTLES DRAWN AEROBIC AND ANAEROBIC Blood Culture adequate volume   Culture   Final    NO GROWTH 3 DAYS Performed at [facility], [address], [place], [place] [address]    Report Status PENDING  Incomplete  Respiratory (~20 pathogens) panel by PCR     Status: None   Collection Time: 07/21/21  2:15 PM   Specimen: Nasopharyngeal Swab; Respiratory  Result Value Ref Range Status   Adenovirus NOT DETECTED NOT DETECTED Final   Coronavirus 229E NOT DETECTED NOT DETECTED Final    Comment: (NOTE) The Coronavirus on the Respiratory Panel, DOES NOT test for the novel  Coronavirus (2019 nCoV)    Coronavirus HKU1 NOT DETECTED NOT DETECTED Final   Coronavirus NL63 NOT DETECTED NOT DETECTED Final   Coronavirus OC43 NOT DETECTED NOT DETECTED Final   Metapneumovirus NOT DETECTED NOT DETECTED Final   Rhinovirus / Enterovirus NOT DETECTED NOT DETECTED Final   Influenza A NOT DETECTED NOT DETECTED Final   Influenza B NOT DETECTED NOT DETECTED Final   Parainfluenza Virus 1 NOT DETECTED NOT DETECTED Final   Parainfluenza Virus 2 NOT DETECTED NOT DETECTED Final   Parainfluenza Virus 3 NOT DETECTED NOT  DETECTED Final   Parainfluenza Virus 4 NOT DETECTED NOT DETECTED Final   Respiratory Syncytial Virus NOT DETECTED NOT DETECTED Final   Bordetella pertussis NOT DETECTED NOT DETECTED Final   Bordetella Parapertussis NOT DETECTED NOT DETECTED Final   Chlamydophila pneumoniae NOT DETECTED NOT DETECTED Final   Mycoplasma pneumoniae NOT DETECTED NOT DETECTED Final    Comment: Performed at [facility], [place]. [address], [place], [place] [address]  Culture, blood (Routine X 2) w Reflex to ID Panel     Status: None (Preliminary result)   Collection Time: 07/21/21  9:47 PM   Specimen: BLOOD  Result Value Ref Range Status   Specimen Description BLOOD RIGHT HAND  Final   Special Requests   Final    BOTTLES DRAWN AEROBIC AND ANAEROBIC Blood Culture adequate volume   Culture   Final    NO GROWTH 3 DAYS Performed at  [facility], [address], [place], [place] [address]    Report Status PENDING  Incomplete    Procedures/Studies: DG Chest 2 View  Result Date: 07/20/2021 CLINICAL DATA:  uri symptoms x 1 week. SOB with chest tightness, hypoxic, tachypnea EXAM: CHEST - 2 VIEW COMPARISON:  None. FINDINGS: The heart and mediastinal contours are within normal limits. No focal consolidation. No pulmonary edema. No pleural effusion. No pneumothorax. No acute osseous abnormality. IMPRESSION: No active cardiopulmonary disease. Electronically Signed   By: Iven Finn M.D.   On: 07/20/2021 20:41   CT Angio Chest PE W/Cm &/Or Wo Cm  Result Date: 07/21/2021 CLINICAL DATA:  Shortness of breath with chest tightness. Hypoxia and tachypnea. EXAM: CT ANGIOGRAPHY CHEST WITH CONTRAST TECHNIQUE: Multidetector CT imaging of the chest was performed using the standard protocol during bolus administration of intravenous contrast. Multiplanar CT image reconstructions and MIPs were obtained to evaluate the vascular anatomy. CONTRAST:  167m OMNIPAQUE IOHEXOL 350 MG/ML SOLN COMPARISON:  None. FINDINGS:  Cardiovascular: The heart size is normal. No substantial pericardial effusion. There is no filling defect within the opacified pulmonary arteries to suggest the presence of an acute pulmonary embolus. Mediastinum/Nodes: No mediastinal lymphadenopathy. Ill-defined subtle soft tissue attenuation anterior mediastinum is compatible with thymic remnant. There is no hilar lymphadenopathy. The esophagus has normal imaging features. There is no axillary lymphadenopathy. Lungs/Pleura: Scattered small patchy areas of ground-glass opacity are seen in both lungs, nonspecific but potentially related to an infectious/inflammatory alveolitis. No dense focal airspace consolidation. No pulmonary edema or pleural effusion. Upper Abdomen: Unremarkable. Musculoskeletal: No worrisome lytic or sclerotic osseous abnormality. Review of the MIP images confirms the above findings. IMPRESSION: 1. No CT evidence for acute pulmonary embolus. 2. Scattered small patchy areas of ground-glass opacity in both lungs, nonspecific but potentially related to an infectious/inflammatory alveolitis. Electronically Signed   By: EMisty StanleyM.D.   On: 07/21/2021 06:02    Labs: BNP (last 3 results) No results for input(s): BNP in the last 8760 hours. Basic Metabolic Panel: Recent Labs  Lab 07/20/21 2013  NA 141  K 4.0  CL 107  CO2 28  GLUCOSE 99  BUN 13  CREATININE 0.73  CALCIUM 9.1   Liver Function Tests: Recent Labs  Lab 07/20/21 2013  AST 33  ALT 40  ALKPHOS 88  BILITOT 0.6  PROT 7.7  ALBUMIN 3.8   No results for input(s): LIPASE, AMYLASE in the last 168 hours. No results for input(s): AMMONIA in the last 168 hours. CBC: Recent Labs  Lab 07/20/21 2013 07/22/21 0455  WBC 14.1* 18.2*  NEUTROABS 8.1*  --   HGB 14.0 13.5  HCT 42.5 40.3  MCV 93.0 93.5  PLT 375 306   Cardiac Enzymes: No results for input(s): CKTOTAL, CKMB, CKMBINDEX, TROPONINI in the last 168 hours. BNP: Invalid input(s): POCBNP CBG: No results  for input(s): GLUCAP in the last 168 hours. D-Dimer No results for input(s): DDIMER in the last 72 hours. Hgb A1c No results for input(s): HGBA1C in the last 72 hours. Lipid Profile No results for input(s): CHOL, HDL, LDLCALC, TRIG, CHOLHDL, LDLDIRECT in the last 72 hours. Thyroid function studies No results for input(s): TSH, T4TOTAL, T3FREE, THYROIDAB in the last 72 hours.  Invalid input(s): FREET3 Anemia work up No results for input(s): VITAMINB12, FOLATE, FERRITIN, TIBC, IRON, RETICCTPCT in the last 72 hours. Urinalysis    Component Value Date/Time   COLORURINE YELLOW 02/26/2019 1300   APPEARANCEUR HAZY (A) 02/26/2019 1300   LABSPEC >  1.030 (H) 02/26/2019 1300   PHURINE 5.5 02/26/2019 1300   GLUCOSEU NEGATIVE 02/26/2019 1300   HGBUR LARGE (A) 02/26/2019 1300   BILIRUBINUR SMALL (A) 02/26/2019 1300   KETONESUR TRACE (A) 02/26/2019 1300   PROTEINUR TRACE (A) 02/26/2019 1300   NITRITE NEGATIVE 02/26/2019 1300   LEUKOCYTESUR TRACE (A) 02/26/2019 1300   Sepsis Labs Invalid input(s): PROCALCITONIN,  WBC,  LACTICIDVEN Microbiology Recent Results (from the past 240 hour(s))  Resp Panel by RT-PCR (Flu A&B, Covid) Nasopharyngeal Swab     Status: None   Collection Time: 07/20/21  8:13 PM   Specimen: Nasopharyngeal Swab; Nasopharyngeal(NP) swabs in vial transport medium  Result Value Ref Range Status   SARS Coronavirus 2 by RT PCR NEGATIVE NEGATIVE Final    Comment: (NOTE) SARS-CoV-2 target nucleic acids are NOT DETECTED.  The SARS-CoV-2 RNA is generally detectable in upper respiratory specimens during the acute phase of infection. The lowest concentration of SARS-CoV-2 viral copies this assay can detect is 138 copies/mL. A negative result does not preclude SARS-Cov-2 infection and should not be used as the sole basis for treatment or other patient management decisions. A negative result may occur with  improper specimen collection/handling, submission of specimen other than  nasopharyngeal swab, presence of viral mutation(s) within the areas targeted by this assay, and inadequate number of viral copies(<138 copies/mL). A negative result must be combined with clinical observations, patient history, and epidemiological information. The expected result is Negative.  Fact Sheet for Patients:  EntrepreneurPulse.com.au  Fact Sheet for Healthcare Providers:  IncredibleEmployment.be  This test is no t yet approved or cleared by the [place] FDA and  has been authorized for detection and/or diagnosis of SARS-CoV-2 by FDA under an Emergency Use Authorization (EUA). This EUA will remain  in effect (meaning this test can be used) for the duration of the COVID-19 declaration under Section 564(b)(1) of the Act, 21 U.S.C.section 360bbb-3(b)(1), unless the authorization is terminated  or revoked sooner.       Influenza A by PCR NEGATIVE NEGATIVE Final   Influenza B by PCR NEGATIVE NEGATIVE Final    Comment: (NOTE) The Xpert Xpress SARS-CoV-2/FLU/RSV plus assay is intended as an aid in the diagnosis of influenza from Nasopharyngeal swab specimens and should not be used as a sole basis for treatment. Nasal washings and aspirates are unacceptable for Xpert Xpress SARS-CoV-2/FLU/RSV testing.  Fact Sheet for Patients: EntrepreneurPulse.com.au  Fact Sheet for Healthcare Providers: IncredibleEmployment.be  This test is not yet approved or cleared by the [place] FDA and has been authorized for detection and/or diagnosis of SARS-CoV-2 by FDA under an Emergency Use Authorization (EUA). This EUA will remain in effect (meaning this test can be used) for the duration of the COVID-19 declaration under Section 564(b)(1) of the Act, 21 U.S.C. section 360bbb-3(b)(1), unless the authorization is terminated or revoked.  Performed at [facility], [address] [place], [place]  [address]   Culture, blood (x 2)     Status: None (Preliminary result)   Collection Time: 07/21/21  9:09 AM   Specimen: BLOOD  Result Value Ref Range Status   Specimen Description BLOOD RIGHT ANTECUBITAL  Final   Special Requests   Final    BOTTLES DRAWN AEROBIC AND ANAEROBIC Blood Culture adequate volume   Culture   Final    NO GROWTH 3 DAYS Performed at [facility], [address] [place], [place] [address]    Report Status PENDING  Incomplete  Respiratory (~20 pathogens) panel by  PCR     Status: None   Collection Time: 07/21/21  2:15 PM   Specimen: Nasopharyngeal Swab; Respiratory  Result Value Ref Range Status   Adenovirus NOT DETECTED NOT DETECTED Final   Coronavirus 229E NOT DETECTED NOT DETECTED Final    Comment: (NOTE) The Coronavirus on the Respiratory Panel, DOES NOT test for the novel  Coronavirus (2019 nCoV)    Coronavirus HKU1 NOT DETECTED NOT DETECTED Final   Coronavirus NL63 NOT DETECTED NOT DETECTED Final   Coronavirus OC43 NOT DETECTED NOT DETECTED Final   Metapneumovirus NOT DETECTED NOT DETECTED Final   Rhinovirus / Enterovirus NOT DETECTED NOT DETECTED Final   Influenza A NOT DETECTED NOT DETECTED Final   Influenza B NOT DETECTED NOT DETECTED Final   Parainfluenza Virus 1 NOT DETECTED NOT DETECTED Final   Parainfluenza Virus 2 NOT DETECTED NOT DETECTED Final   Parainfluenza Virus 3 NOT DETECTED NOT DETECTED Final   Parainfluenza Virus 4 NOT DETECTED NOT DETECTED Final   Respiratory Syncytial Virus NOT DETECTED NOT DETECTED Final   Bordetella pertussis NOT DETECTED NOT DETECTED Final   Bordetella Parapertussis NOT DETECTED NOT DETECTED Final   Chlamydophila pneumoniae NOT DETECTED NOT DETECTED Final   Mycoplasma pneumoniae NOT DETECTED NOT DETECTED Final    Comment: Performed at [facility], [place]. [address] [place], [place] [address]  Culture, blood (Routine X 2) w Reflex to ID Panel     Status: None (Preliminary result)   Collection Time:  07/21/21  9:47 PM   Specimen: BLOOD  Result Value Ref Range Status   Specimen Description BLOOD RIGHT HAND  Final   Special Requests   Final    BOTTLES DRAWN AEROBIC AND ANAEROBIC Blood Culture adequate volume   Culture   Final    NO GROWTH 3 DAYS Performed at [facility], [address], [place], [place] [address]    Report Status PENDING  Incomplete     Time coordinating discharge: 25 minutes  SIGNED: Antonieta Pert, MD  [facility] 07/24/2021, 11:58 AM  If 7PM-7AM, please contact night-coverage [email or web address]

## 2021-07-26 LAB — CULTURE, BLOOD (ROUTINE X 2)
Culture: NO GROWTH
Culture: NO GROWTH
Special Requests: ADEQUATE
Special Requests: ADEQUATE

## 2021-12-07 ENCOUNTER — Ambulatory Visit: Payer: Medicaid Other

## 2022-06-18 DIAGNOSIS — Z419 Encounter for procedure for purposes other than remedying health state, unspecified: Secondary | ICD-10-CM | POA: Diagnosis not present

## 2022-07-19 DIAGNOSIS — Z419 Encounter for procedure for purposes other than remedying health state, unspecified: Secondary | ICD-10-CM | POA: Diagnosis not present

## 2022-08-19 DIAGNOSIS — Z419 Encounter for procedure for purposes other than remedying health state, unspecified: Secondary | ICD-10-CM | POA: Diagnosis not present

## 2022-09-15 DIAGNOSIS — Z9889 Other specified postprocedural states: Secondary | ICD-10-CM | POA: Diagnosis not present

## 2022-09-15 DIAGNOSIS — M25562 Pain in left knee: Secondary | ICD-10-CM | POA: Diagnosis not present

## 2022-09-17 DIAGNOSIS — Z419 Encounter for procedure for purposes other than remedying health state, unspecified: Secondary | ICD-10-CM | POA: Diagnosis not present

## 2022-10-08 IMAGING — CT CT ANGIO CHEST
2 of 7 series · 18 of 46 positions shown · IV contrast (APPLIED)
Comparison: None.

CLINICAL DATA: Shortness of breath with chest tightness. Hypoxia
and tachypnea.

EXAM:
CT ANGIOGRAPHY CHEST WITH CONTRAST
TECHNIQUE: Multidetector CT imaging of the chest was performed using the
standard protocol during bolus administration of intravenous
contrast. Multiplanar CT image reconstructions and MIPs were
obtained to evaluate the vascular anatomy.
CONTRAST:  100mL OMNIPAQUE IOHEXOL 350 MG/ML SOLN

[Series 5: thins · axial · 0.79mm/px · z∈[-297,-57]mm · 15 of 334 slices shown]
[im 17/334  lung]
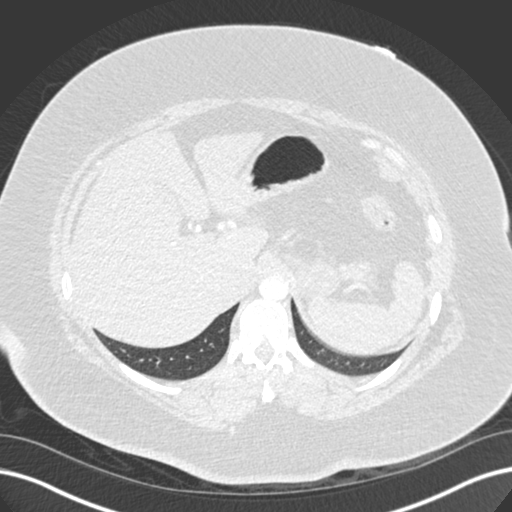
[im 34/334  soft-tissue]
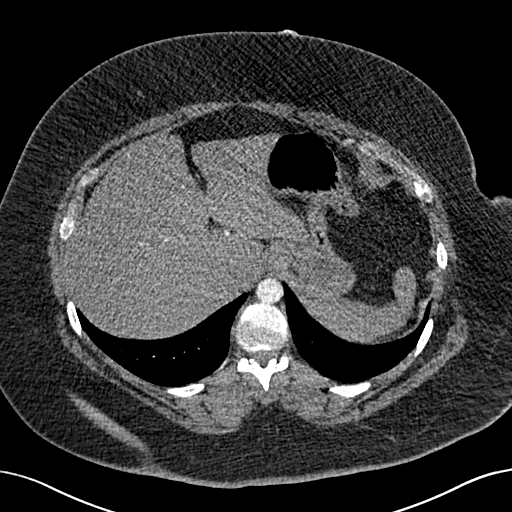
[im 67/334  lung]
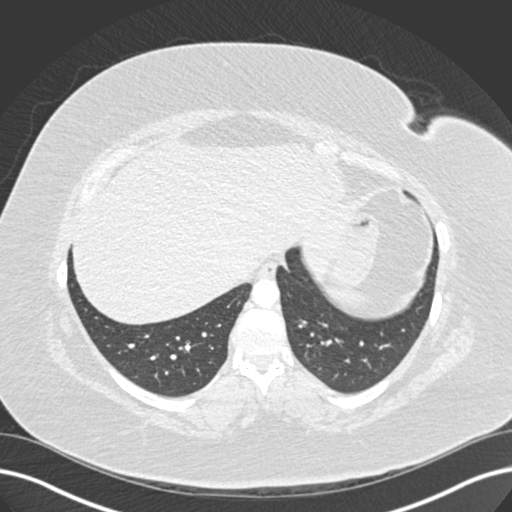
[im 84/334  soft-tissue]
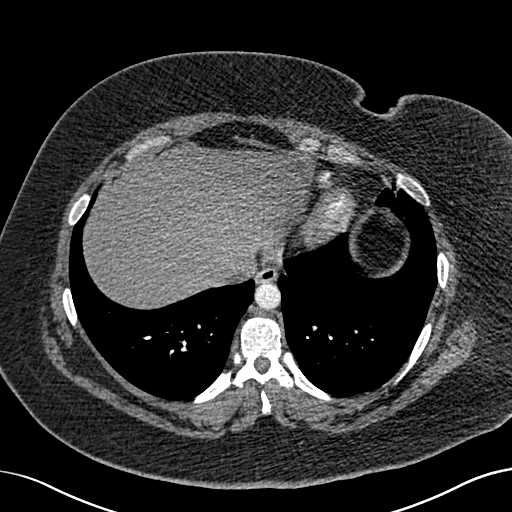
[im 100/334  lung]
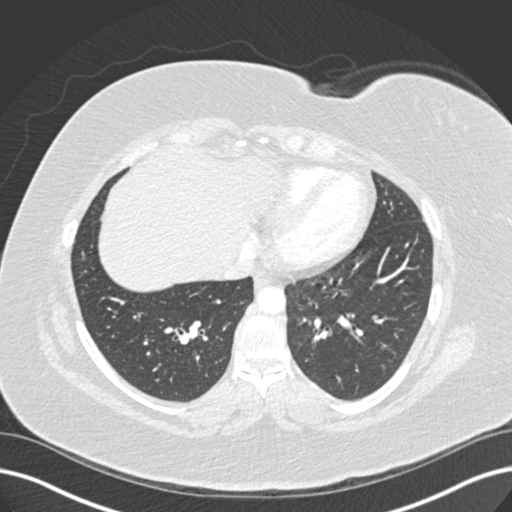
[im 117/334  soft-tissue]
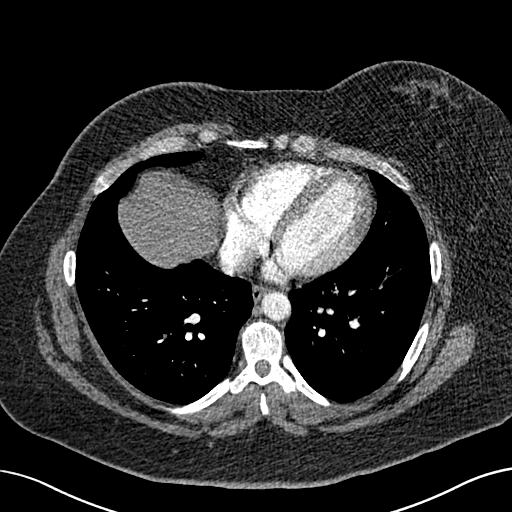
[im 150/334  lung]
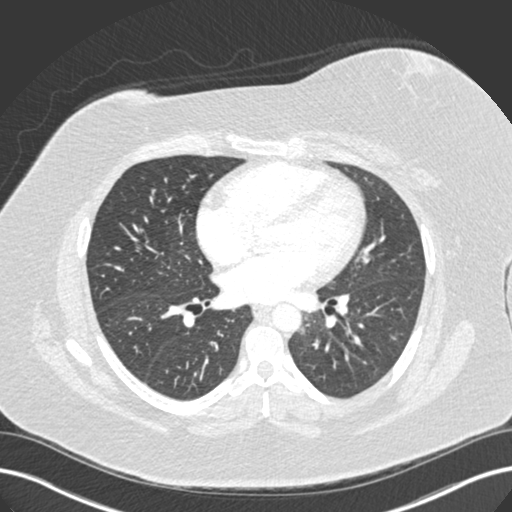
[im 167/334  soft-tissue]
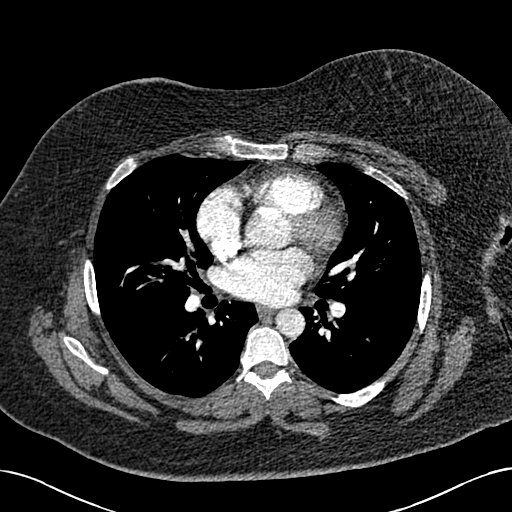
[im 184/334  lung]
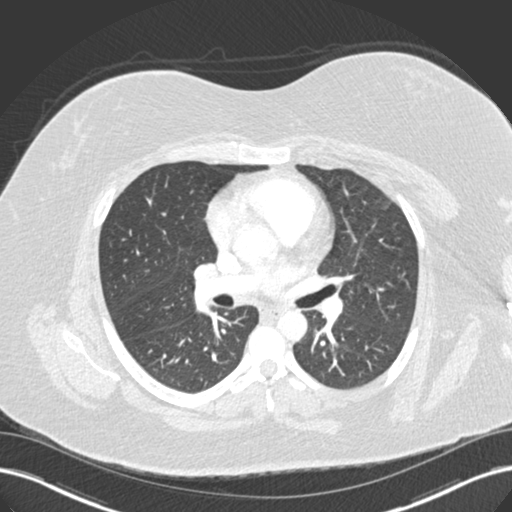
[im 217/334  soft-tissue]
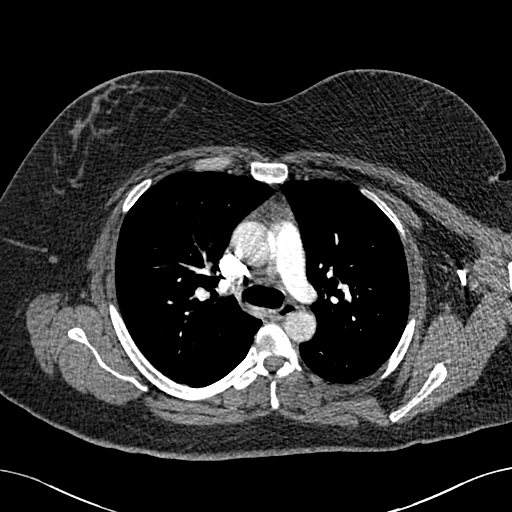
[im 234/334  lung]
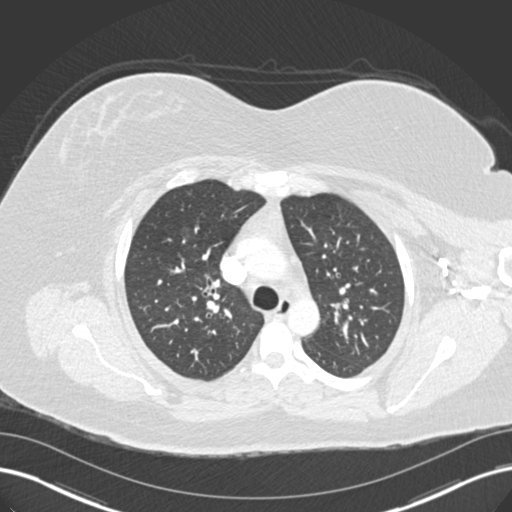
[im 250/334  soft-tissue]
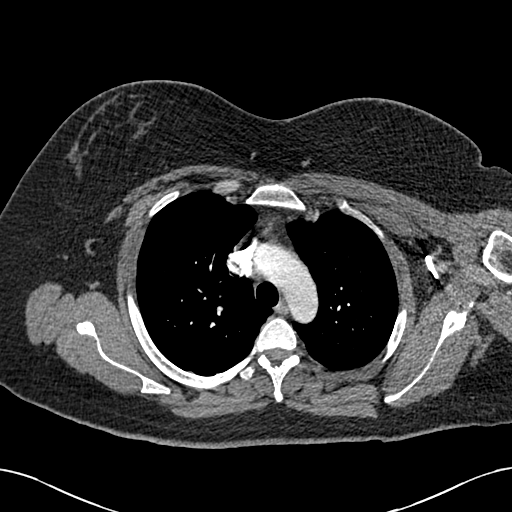
[im 267/334  lung]
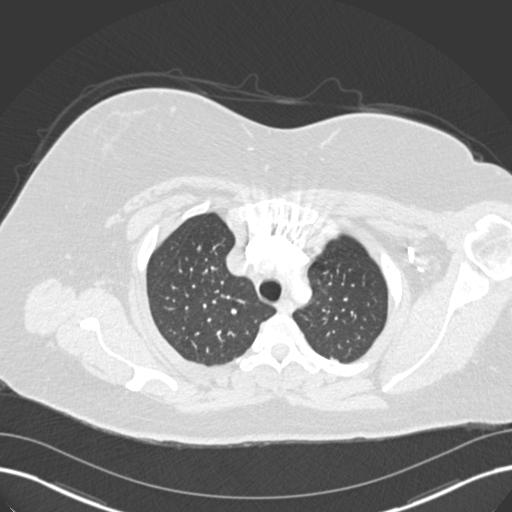
[im 300/334  soft-tissue]
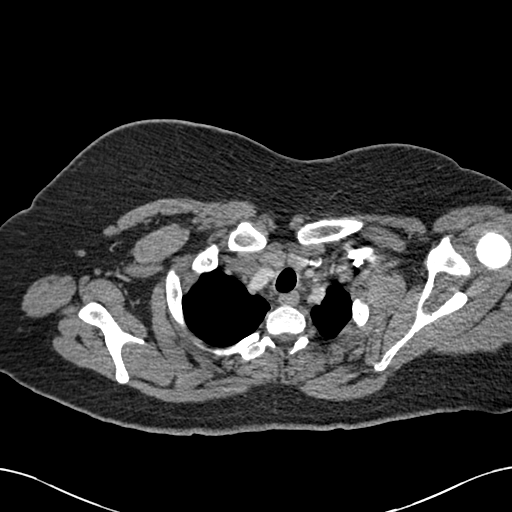
[im 317/334  lung]
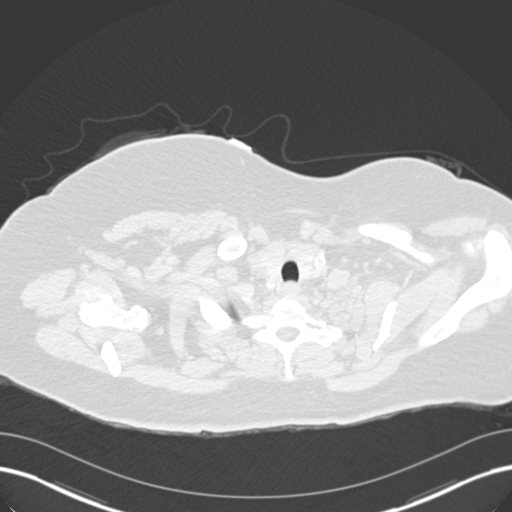

[Series 7: coronal mpr · coronal · 0.55mm/px · 3 of 78 slices shown]
[im 20/78  soft-tissue]
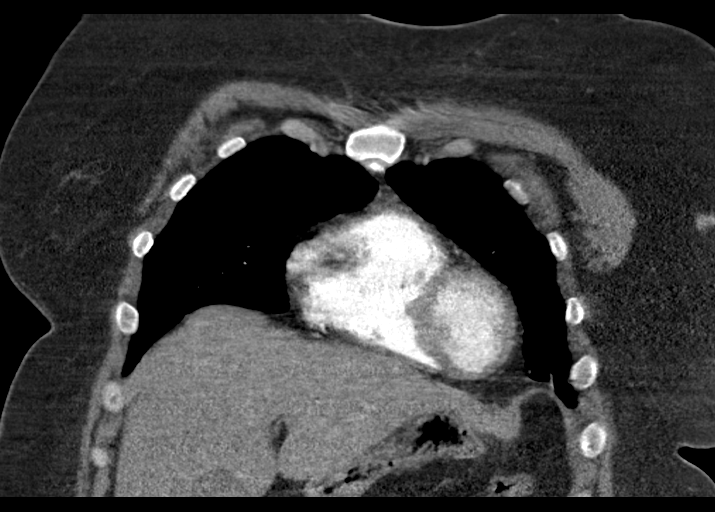
[im 39/78  soft-tissue]
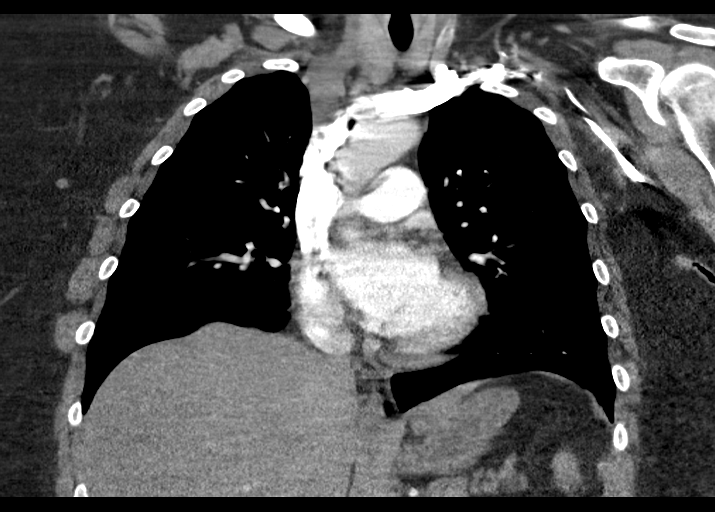
[im 58/78  soft-tissue]
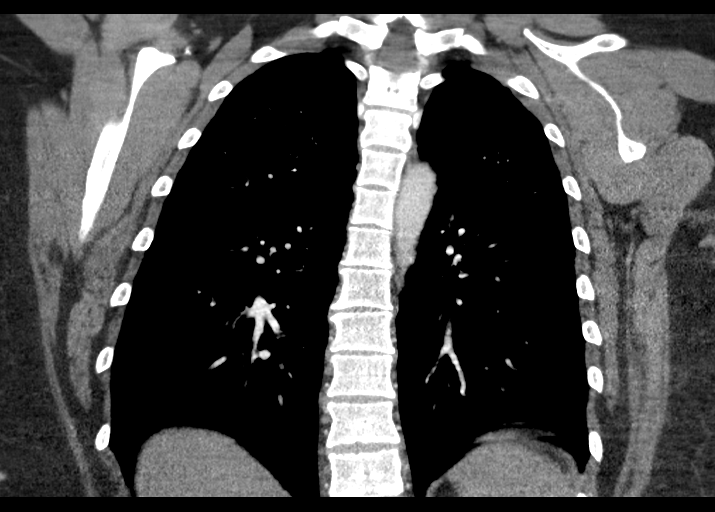

[18 of 46 positions shown; findings below may reference images not displayed]

FINDINGS: Cardiovascular: The heart size is normal. No substantial pericardial
effusion. There is no filling defect within the opacified pulmonary
arteries to suggest the presence of an acute pulmonary embolus.

Mediastinum/Nodes: No mediastinal lymphadenopathy. Ill-defined
subtle soft tissue attenuation anterior mediastinum is compatible
with thymic remnant. There is no hilar lymphadenopathy. The
esophagus has normal imaging features. There is no axillary
lymphadenopathy.

Lungs/Pleura: Scattered small patchy areas of ground-glass opacity
are seen in both lungs, nonspecific but potentially related to an
infectious/inflammatory alveolitis. No dense focal airspace
consolidation. No pulmonary edema or pleural effusion.

Upper Abdomen: Unremarkable.

Musculoskeletal: No worrisome lytic or sclerotic osseous
abnormality.

Review of the MIP images confirms the above findings.
IMPRESSION: 1. No CT evidence for acute pulmonary embolus.
2. Scattered small patchy areas of ground-glass opacity in both
lungs, nonspecific but potentially related to an
infectious/inflammatory alveolitis.

## 2023-10-15 ENCOUNTER — Emergency Department

## 2023-10-15 ENCOUNTER — Emergency Department
Admission: EM | Admit: 2023-10-15 | Discharge: 2023-10-15 | Disposition: A | Discharge status: Home / Self Care | Attending: Emergency Medicine | Admitting: Emergency Medicine

## 2023-10-15 ENCOUNTER — Other Ambulatory Visit: Payer: Self-pay

## 2023-10-15 DIAGNOSIS — D72829 Elevated white blood cell count, unspecified: Secondary | ICD-10-CM | POA: Insufficient documentation

## 2023-10-15 DIAGNOSIS — R1031 Right lower quadrant pain: Secondary | ICD-10-CM | POA: Insufficient documentation

## 2023-10-15 LAB — CBC
HCT: 46.8 % — ABNORMAL HIGH (ref 36.0–46.0)
Hemoglobin: 15 g/dL (ref 12.0–15.0)
MCH: 30.1 pg (ref 26.0–34.0)
MCHC: 32.1 g/dL (ref 30.0–36.0)
MCV: 93.8 fL (ref 80.0–100.0)
Platelets: 247 10*3/uL (ref 150–400)
RBC: 4.99 MIL/uL (ref 3.87–5.11)
RDW: 13 % (ref 11.5–15.5)
WBC: 11.5 10*3/uL — ABNORMAL HIGH (ref 4.0–10.5)
nRBC: 0 % (ref 0.0–0.2)

## 2023-10-15 LAB — COMPREHENSIVE METABOLIC PANEL WITH GFR
ALT: 23 U/L (ref 0–44)
AST: 21 U/L (ref 15–41)
Albumin: 4 g/dL (ref 3.5–5.0)
Alkaline Phosphatase: 91 U/L (ref 38–126)
Anion gap: 9 (ref 5–15)
BUN: 12 mg/dL (ref 6–20)
CO2: 24 mmol/L (ref 22–32)
Calcium: 9.1 mg/dL (ref 8.9–10.3)
Chloride: 104 mmol/L (ref 98–111)
Creatinine, Ser: 0.81 mg/dL (ref 0.44–1.00)
GFR, Estimated: >60 mL/min (ref >60)
Glucose, Bld: 93 mg/dL (ref 70–99)
Potassium: 3.8 mmol/L (ref 3.5–5.1)
Sodium: 137 mmol/L (ref 135–145)
Total Bilirubin: 0.6 mg/dL (ref 0.0–1.2)
Total Protein: 8 g/dL (ref 6.5–8.1)

## 2023-10-15 LAB — URINALYSIS, ROUTINE W REFLEX MICROSCOPIC
Bilirubin Urine: NEGATIVE
Glucose, UA: NEGATIVE mg/dL
Ketones, ur: NEGATIVE mg/dL
Leukocytes,Ua: NEGATIVE
Nitrite: NEGATIVE
Protein, ur: NEGATIVE mg/dL
Specific Gravity, Urine: 1.026 (ref 1.005–1.030)
pH: 5 (ref 5.0–8.0)

## 2023-10-15 LAB — POC URINE PREG, ED: Preg Test, Ur: NEGATIVE

## 2023-10-15 LAB — LIPASE, BLOOD: Lipase: 25 U/L (ref 11–51)

## 2023-10-15 MED ORDER — IOHEXOL 300 MG/ML  SOLN
80.0000 mL | Freq: Once | INTRAMUSCULAR | Status: AC | PRN
  Administered 2023-10-15: 80 mL via INTRAVENOUS

## 2023-10-15 NOTE — ED Triage Notes (Signed)
 Pt to ed from [facility] via POV for abd pain. She was sent over for appendicitis rule out. Pt is caox4, in no acute distress and ambulatory in triage. Pt denies urinary symptoms. Pt denies fever, N/V. Pain started around Monday and is intermittent.

## 2023-10-15 NOTE — ED Provider Notes (Signed)
 [facility] Provider Note    Event Date/Time   First MD Initiated Contact with Patient 10/15/23 1516     (approximate)   History   Abdominal Pain   HPI Michelle Wallace is a 33 y.o. female presenting today for right lower quadrant abdominal pain.  Patient states symptoms have been present for about 5 days and they are intermittent.  Notices them more with movement and bending over.  Denies any known trauma or lifting heavy things out of the ordinary recently.  Otherwise denies fever, chills, nausea, vomiting, chest pain, shortness of breath, diarrhea, constipation, dysuria, hematuria.  Urgently seen in urgent care and sent here for appendicitis rule out.  Prior history of right fallopian tube removal as well as ovarian cyst.  Last menstrual period ended 10 days ago with no other additional vaginal bleeding or vaginal discharge.     Physical Exam   Triage Vital Signs: ED Triage Vitals  Encounter Vitals Group     BP 10/15/23 1509 (!) 124/91     Systolic BP Percentile --      Diastolic BP Percentile --      Pulse Rate 10/15/23 1509 81     Resp 10/15/23 1509 16     Temp 10/15/23 1509 98.8 F (37.1 C)     Temp Source 10/15/23 1509 Oral     SpO2 10/15/23 1509 98 %     Weight --      Height 10/15/23 1510 5\' 8"  (1.727 m)     Head Circumference --      Peak Flow --      Pain Score 10/15/23 1510 0     Pain Loc --      Pain Education --      Exclude from Growth Chart --     Most recent vital signs: Vitals:   10/15/23 1509  BP: (!) 124/91  Pulse: 81  Resp: 16  Temp: 98.8 F (37.1 C)  SpO2: 98%   Physical Exam: I have reviewed the vital signs and nursing notes. General: Awake, alert, no acute distress.  Nontoxic appearing. Head:  Atraumatic, normocephalic.   ENT:  EOM intact, PERRL. Oral mucosa is pink and moist with no lesions. Neck: Neck is supple with full range of motion, No meningeal signs. Cardiovascular:  RRR, No murmurs. Peripheral  pulses palpable and equal bilaterally. Respiratory:  Symmetrical chest wall expansion.  No rhonchi, rales, or wheezes.  Good air movement throughout.  No use of accessory muscles.   Musculoskeletal:  No cyanosis or edema. Moving extremities with full ROM Abdomen:  Soft, tenderness to palpation in left lower quadrant, nondistended. Neuro:  GCS 15, moving all four extremities, interacting appropriately. Speech clear. Psych:  Calm, appropriate.   Skin:  Warm, dry, no rash.    ED Results / Procedures / Treatments   Labs (all labs ordered are listed, but only abnormal results are displayed) Labs Reviewed  CBC - Abnormal; Notable for the following components:      Result Value   WBC 11.5 (*)    HCT 46.8 (*)    All other components within normal limits  URINALYSIS, ROUTINE W REFLEX MICROSCOPIC - Abnormal; Notable for the following components:   Color, Urine YELLOW (*)    APPearance HAZY (*)    Hgb urine dipstick LARGE (*)    Bacteria, UA RARE (*)    All other components within normal limits  LIPASE, BLOOD  COMPREHENSIVE METABOLIC PANEL WITH GFR  POC URINE  PREG, ED     EKG    RADIOLOGY Independently evaluated CT with no acute pathology   PROCEDURES:  Critical Care performed: No  Procedures   MEDICATIONS ORDERED IN ED: Medications  iohexol (OMNIPAQUE) 300 MG/ML solution 80 mL (80 mLs Intravenous Contrast Given 10/15/23 1648)     IMPRESSION / MDM / ASSESSMENT AND PLAN / ED COURSE  I reviewed the triage vital signs and the nursing notes.                              Differential diagnosis includes, but is not limited to, appendicitis, muscular strain, ovarian cyst, enteritis  Patient's presentation is most consistent with acute complicated illness / injury requiring diagnostic workup.  Patient is a 33 year old female presenting today for right lower quadrant pain x 5 days which is intermittent.  Does have tenderness palpation in the right lower quadrant.  Sent from  urgent care to rule out appendicitis will get CT for further evaluation.  Vital signs otherwise stable with no other accompanying symptoms.  Laboratory workup with slight leukocytosis at 11.5.  Otherwise lipase and CMP normal.  CT imaging showed no acute pathology.  UA with no signs of infection.  Patient otherwise feels well at this time and does not need medication for pain.  Comfortable with discharge with no obvious abnormalities will have her follow-up with PCP as needed and given strict return precautions.     FINAL CLINICAL IMPRESSION(S) / ED DIAGNOSES   Final diagnoses:  Right lower quadrant abdominal pain     Rx / DC Orders   ED Discharge Orders     None        Note:  This document was prepared using Dragon voice recognition software and may include unintentional dictation errors.   Janith Lima, MD 10/15/23 [phone]

## 2023-10-15 NOTE — Discharge Instructions (Signed)
 CT imaging showed no evidence of appendicitis or other acute intra-abdominal infections or concerns.  You can start with simple over-the-counter medications like Tylenol and ibuprofen to help with pain symptoms and follow-up with your primary care provider as needed.  Please return for any severe worsening symptoms.
